# Patient Record
Sex: Male | Born: 1970 | Race: Black or African American | Hispanic: No | Marital: Married | Smoking: Never smoker
Health system: Southern US, Community
[De-identification: ages and names within clinical notes are randomized; demographics above are authoritative.]

## PROBLEM LIST (undated history)

## (undated) ENCOUNTER — Emergency Department (HOSPITAL_BASED_OUTPATIENT_CLINIC_OR_DEPARTMENT_OTHER): Admission: EM | Payer: BC Managed Care – PPO | Source: Home / Self Care

## (undated) DIAGNOSIS — I77810 Thoracic aortic ectasia: Secondary | ICD-10-CM

## (undated) DIAGNOSIS — M1990 Unspecified osteoarthritis, unspecified site: Secondary | ICD-10-CM

## (undated) DIAGNOSIS — I82409 Acute embolism and thrombosis of unspecified deep veins of unspecified lower extremity: Secondary | ICD-10-CM

## (undated) DIAGNOSIS — E119 Type 2 diabetes mellitus without complications: Secondary | ICD-10-CM

## (undated) DIAGNOSIS — L511 Stevens-Johnson syndrome: Secondary | ICD-10-CM

## (undated) DIAGNOSIS — I700 Atherosclerosis of aorta: Secondary | ICD-10-CM

## (undated) HISTORY — PX: KNEE ARTHROSCOPY: SHX127

## (undated) HISTORY — PX: LYMPH GLAND EXCISION: SHX13

## (undated) HISTORY — PX: SPINE SURGERY: SHX786

## (undated) HISTORY — DX: Unspecified osteoarthritis, unspecified site: M19.90

## (undated) HISTORY — DX: Stevens-Johnson syndrome: L51.1

## (undated) HISTORY — DX: Thoracic aortic ectasia: I77.810

## (undated) HISTORY — DX: Atherosclerosis of aorta: I70.0

## (undated) HISTORY — PX: CHEST TUBE INSERTION: SHX231

---
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
## 1999-03-13 ENCOUNTER — Encounter: Payer: Self-pay | Admitting: Emergency Medicine

## 1999-03-13 ENCOUNTER — Emergency Department (HOSPITAL_COMMUNITY): Admission: EM | Admit: 1999-03-13 | Discharge: 1999-03-13 | Payer: Self-pay | Admitting: Emergency Medicine

## 1999-03-15 ENCOUNTER — Encounter: Admission: RE | Admit: 1999-03-15 | Discharge: 1999-03-15 | Payer: Self-pay | Admitting: Thoracic Surgery

## 1999-03-15 ENCOUNTER — Encounter: Payer: Self-pay | Admitting: Thoracic Surgery

## 1999-03-21 ENCOUNTER — Encounter: Admission: RE | Admit: 1999-03-21 | Discharge: 1999-03-21 | Payer: Self-pay | Admitting: Thoracic Surgery

## 1999-03-21 ENCOUNTER — Encounter: Payer: Self-pay | Admitting: Thoracic Surgery

## 2001-12-16 ENCOUNTER — Encounter: Payer: Self-pay | Admitting: Emergency Medicine

## 2001-12-16 ENCOUNTER — Emergency Department (HOSPITAL_COMMUNITY): Admission: EM | Admit: 2001-12-16 | Discharge: 2001-12-16 | Payer: Self-pay | Admitting: Emergency Medicine

## 2002-10-07 ENCOUNTER — Inpatient Hospital Stay (HOSPITAL_COMMUNITY): Admission: AD | Admit: 2002-10-07 | Discharge: 2002-10-10 | Payer: Self-pay | Admitting: Family Medicine

## 2002-10-22 ENCOUNTER — Encounter: Admission: RE | Admit: 2002-10-22 | Discharge: 2002-10-22 | Payer: Self-pay | Admitting: Family Medicine

## 2004-09-10 ENCOUNTER — Emergency Department (HOSPITAL_COMMUNITY): Admission: EM | Admit: 2004-09-10 | Discharge: 2004-09-10 | Payer: Self-pay | Admitting: Emergency Medicine

## 2005-04-08 ENCOUNTER — Emergency Department (HOSPITAL_COMMUNITY): Admission: EM | Admit: 2005-04-08 | Discharge: 2005-04-08 | Payer: Self-pay | Admitting: Family Medicine

## 2006-07-18 ENCOUNTER — Emergency Department (HOSPITAL_COMMUNITY): Admission: EM | Admit: 2006-07-18 | Discharge: 2006-07-18 | Payer: Self-pay | Admitting: Emergency Medicine

## 2008-02-06 HISTORY — PX: OTHER SURGICAL HISTORY: SHX169

## 2008-04-14 ENCOUNTER — Ambulatory Visit: Payer: Self-pay | Admitting: Internal Medicine

## 2008-04-14 ENCOUNTER — Observation Stay (HOSPITAL_COMMUNITY): Admission: EM | Admit: 2008-04-14 | Discharge: 2008-04-16 | Payer: Self-pay | Admitting: Emergency Medicine

## 2009-05-12 ENCOUNTER — Emergency Department (HOSPITAL_COMMUNITY): Admission: EM | Admit: 2009-05-12 | Discharge: 2009-05-12 | Payer: Self-pay | Admitting: Emergency Medicine

## 2010-05-18 LAB — BASIC METABOLIC PANEL
BUN: 9 mg/dL (ref 6–23)
CO2: 28 mEq/L (ref 19–32)
CO2: 28 mEq/L (ref 19–32)
Calcium: 9 mg/dL (ref 8.4–10.5)
Chloride: 104 mEq/L (ref 96–112)
Creatinine, Ser: 0.83 mg/dL (ref 0.4–1.5)
GFR calc Af Amer: >60 mL/min (ref >60)
GFR calc non Af Amer: >60 mL/min (ref >60)
Glucose, Bld: 134 mg/dL — ABNORMAL HIGH (ref 70–99)
Glucose, Bld: 87 mg/dL (ref 70–99)
Potassium: 3.6 mEq/L (ref 3.5–5.1)
Potassium: 3.7 mEq/L (ref 3.5–5.1)
Sodium: 141 mEq/L (ref 135–145)
Sodium: 141 mEq/L (ref 135–145)

## 2010-05-18 LAB — CBC
HCT: 34.6 % — ABNORMAL LOW (ref 39.0–52.0)
HCT: 39.7 % (ref 39.0–52.0)
Hemoglobin: 13.4 g/dL (ref 13.0–17.0)
MCHC: 33.7 g/dL (ref 30.0–36.0)
MCHC: 34.6 g/dL (ref 30.0–36.0)
MCV: 85.5 fL (ref 78.0–100.0)
MCV: 86.1 fL (ref 78.0–100.0)
Platelets: 367 10*3/uL (ref 150–400)
RDW: 12.3 % (ref 11.5–15.5)
RDW: 12.6 % (ref 11.5–15.5)

## 2010-05-18 LAB — DIFFERENTIAL
Basophils Absolute: 0 10*3/uL (ref 0.0–0.1)
Basophils Relative: 0 % (ref 0–1)
Eosinophils Relative: 4 % (ref 0–5)
Monocytes Absolute: 0.8 10*3/uL (ref 0.1–1.0)

## 2010-05-18 LAB — HEPATIC FUNCTION PANEL
AST: 18 U/L (ref 0–37)
Bilirubin, Direct: 0.1 mg/dL (ref 0.0–0.3)
Indirect Bilirubin: 0.4 mg/dL (ref 0.3–0.9)

## 2010-05-18 LAB — RAPID URINE DRUG SCREEN, HOSP PERFORMED
Cocaine: NOT DETECTED
Tetrahydrocannabinol: NOT DETECTED

## 2010-05-18 LAB — URINALYSIS, ROUTINE W REFLEX MICROSCOPIC
Bilirubin Urine: NEGATIVE
Ketones, ur: NEGATIVE mg/dL
Nitrite: NEGATIVE
Urobilinogen, UA: 1 mg/dL (ref 0.0–1.0)
pH: 6 (ref 5.0–8.0)

## 2010-05-18 LAB — HEPATITIS PANEL, ACUTE
HCV Ab: NEGATIVE
Hep B C IgM: NEGATIVE
Hepatitis B Surface Ag: NEGATIVE

## 2010-05-18 LAB — FLUORESCENT TREPONEMAL AB(FTA)-IGG-BLD

## 2010-05-18 LAB — SEDIMENTATION RATE: Sed Rate: 6 mm/hr (ref 0–16)

## 2010-06-20 NOTE — Discharge Summary (Signed)
[name], [name] NO.:  [id number]   MEDICAL RECORD NO.:  [id number]          PATIENT TYPE:  INP   LOCATION:  1540                         FACILITY:  [facility]   PHYSICIAN:  Francisco Lawson, M.D.  DATE OF BIRTH:  1971/01/14   DATE OF ADMISSION:  04/14/2008  DATE OF DISCHARGE:  04/16/2008                               DISCHARGE SUMMARY   DISCHARGE DIAGNOSES:  1. Erythema multiforme.  2. History of methicillin-resistant Staphylococcus aureus cellulitis      in 2004, right lower extremity.  3. 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 History of subcutaneous hemothoraces at age 38-15.  53. Gastroesophageal reflux disease.   DISCHARGE MEDICATIONS:  1. Benadryl 25 mg tablet every 6 hours as needed.  2. Oxycodone 5 mg 1 tablet every 4-6 hours for pain.   DISPOSITION AND FOLLOW UP:  The patient was discharged from the unit in  stable condition with rash improving on soles and palms.  The patient is  ambulatory, stable, and afebrile. He will follow up with primary care  physician, Dr. Laney Pastor, for further evaluation and management.  In  addition, he will follow up with his dermatologist, Dr. Ronnald Ramp.   PROCEDURES:  April 14, 2008 CXR: biapical lung resection, questionable  COPD-based changes, no acute cardiopulmonary events.   CONSULTATIONS:  None.   HISTORY OF PRESENT ILLNESS:  The patient is a 40 year old male with  questionable diagnosis of Stevens-Johnson syndrome in 2004 and erythema  multiforme diagnosed as well in 2004.  The patient reports having first  episode of erythema multiforme in 2002 and was flare free until 2004  and since 2004 this is a new flare of EM.  This flare started 4-5 days  ago and has been worsening and as per the patient, the worst episode he  has had so far. It involves palms, soles, legs, arms, genitals, and is  painful to touch. Not itching.  As a result he is unable  to stand on  his feet.  In addition, he reports  generalized weakness.  He denies  fevers or chills, sick contacts, recent trauma. He does travel a lot due  to his job requirements.   ALLERGIES:  He has multiple allergies to FRUITS: LEMONS,  PINEAPPLES,CITRUS, and BANANAS, and MEDICATIONS: NSAID, PENICILLIN, and  TYLENOL.   VITAL SIGNS:  T 97.7, BP 126/79, P 78, R 18, Sat 95% on room air.   PHYSICAL EXAMINATION:  GENERAL:  Not in acute distress.  HEENT: PERRLA.  EOMI.  Nonicteric sclerae. No conjunctival pallor.  NECK: Supple.  No lymphadenopathy.  RESPIRATORY:  Scattered wheezes, mild, at baseline.  No stridor.  Good  air movement.  HEART:  Regular rate and rhythm.  No murmurs, rubs, or gallops.  ABDOMEN: Soft and nontender. No masses.  EXTREMITIES:  No edema. No cyanosis.  SKIN:  Tattoos, target-shaped, flat, slightly raised lesions on palms,  soles, genitals, arms, hands. Some lichenification of skin on legs and  palms.  LYMPHATICS: No lymphadenopathy of axillary, inguinal, or cervical  anterior or posterior lymph nodes  NEUROLOGICAL:  Alert and oriented  x 3.  Cranial nerves II through XII  are grossly intact.  No focal neurological deficits.   LABS:  Na 141, K 3.6, Cl 105, HCO3 28, BUN 12, Cr 1.02, Glu 87.  WBCs 7.5, ANC 4.3, Hg 13.4, MCV 86, Plt 426.   HOSPITAL COURSE BY PROBLEM:  1. Erythema multiforme - in patient with a history of 2 previous      flares in 2004 and first episode in 2002.  On physical examination      lesions are classic target lesions consistent with erythema      multiforme.  Unclear etiology, likely idiopathic, HSV negative, HIV      negative, hepatitis panel negative, UDS negative, RPR negative as      well.  This workup has been done on other previous visits during      these flare-ups and all the tests were negative at that time as      well.  The patient was clinically better by day 2 of      hospitalization and was able to ambulate with less pain.  He was      discharged on oxycodone for pain  control and will follow up with      his primary care doctor as well as dermatologist in an outpatient      setting.  The patient was advised to call back, come to the ER if      his symptoms do not improve or get worse.   Vitals on discharge  T 99.6, BP 118/85, P 89, R 18, Sat 98% on room air.   Labs from April 15, 2008  Na 141, K 3.7, Cl 104, HCO3 28, BUN 9, Cr 0.83, Glu 134.  WBCs 10, ANC 7.5, Hg 12, Plt 362.   Over 30 minutes were spent on discharging  the patient.      Francisco Curet, MD  Electronically Signed      Francisco Lawson, M.D.  Electronically Signed    IM/MEDQ  D:  04/16/2008  T:  04/16/2008  Job:  [id number]

## 2010-06-23 ENCOUNTER — Ambulatory Visit (INDEPENDENT_AMBULATORY_CARE_PROVIDER_SITE_OTHER): Payer: Medicare Other

## 2010-06-23 ENCOUNTER — Inpatient Hospital Stay (INDEPENDENT_AMBULATORY_CARE_PROVIDER_SITE_OTHER)
Admission: RE | Admit: 2010-06-23 | Discharge: 2010-06-23 | Disposition: A | Discharge status: Home / Self Care | Payer: Medicare Other | Source: Ambulatory Visit | Attending: Family Medicine | Admitting: Family Medicine

## 2010-06-23 DIAGNOSIS — IMO0002 Reserved for concepts with insufficient information to code with codable children: Secondary | ICD-10-CM

## 2010-06-23 NOTE — H&P (Signed)
NAME:  Francisco Lawson, Francisco Lawson                         ACCOUNT NO.:  [id number]   MEDICAL RECORD NO.:  [id number]                   PATIENT TYPE:  INP   LOCATION:  5735                                 FACILITY:  [facility]   PHYSICIAN:  Leighton Roach McDiarmid, M.D.             DATE OF BIRTH:  18-Dec-1970   DATE OF ADMISSION:  10/07/2002  DATE OF DISCHARGE:                                HISTORY & PHYSICAL   PRIMARY CARE Dahir Ayer:  [facility] [place].   CHIEF COMPLAINT:  Right lower extremity pain and swelling.   HISTORY OF PRESENT ILLNESS:  [name] is a 40 year old black male who  was in good health until about four days ago, when he awoke with a burning  tongue and whelps on her lips.  He had no tongue or lip swelling at that  time and was unable to bear weight on his right lower extremity secondary to  pain.  He had noted a small lesion on his right lower extremity as well as  right lower leg on the lateral aspect about two days prior (October 01, 2002)  which was pruritic.  He assumed this was a mosquito bite, as it looked like  one, although he did not see a bug bite him.  He went to the emergency  department in [place], [place] four days prior to admission (he was there  for work from October 01, 2002 through October 05, 2002), where he was told  that he had a brown recluse spider bite.  At that time, the lesion on the  lateral aspect of the right lower leg was red with some dark or black areas,  was more swollen, larger in size and draining pus, per the patient.  He was  started on Keflex, ibuprofen and hydrocodone.  He flew back to [place] two days ago.  One day prior to admission, he developed increased  swelling of his lips and development of blisters on his hands, knees, his  shins and elbows.  His right lower leg and foot had increased swelling and  continued drainage from the wound with splitting of the skin.  Today, he  went to [facility] for increased  swelling of his right foot,  throbbing pain in the foot and continued blisters/peeling of the skin.  He  thinks that today his leg looks slightly decreased in size, although the  foot has increased in size.  He has been cleaning the wound with peroxide,  bandaging it with an Ace wrap and using ice packs to help decrease the  swelling and pain.  He was sent to [facility] for failed  outpatient oral (p.o.) antibiotics for a cellulitis.   PAST MEDICAL HISTORY:  1. History of possible Stevens-Johnson syndrome in March 2004.  The cause     was unknown.  Per patient, was worse than now, to the point that he  was     unable to talk secondary to swelling.  He was treated as an outpatient     and given steroids, which led to resolution of the rash.  2. History of bilateral spontaneous pneumothoraces at age 23.  3. GERD.   PAST SURGICAL HISTORY:  Laser treatment and chest tubes for his  pneumothoraces.   MEDICATIONS:  1. Keflex 500 mg p.o. q.6 h. started on October 03, 2002.  2. Ibuprofen.  3. Hydrocodone.   ALLERGIES:  No known drug allergies.   SOCIAL HISTORY:  The patient lives alone in [place].  He has a girlfriend  of three years.  He also has family living in [place] and attended  [employer].  No college education.  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  His job lets him travel all over the country.  He denies any  tobacco, alcohol, IV drug use, other drugs or unprotected sex.  He does have  a 31-year-old son who lives with the son's mom.   REVIEW OF SYSTEMS:  Review of systems positive for chills and subjective  fevers (patient had a temperature of 100.6 in emergency department in  [place]).  Positive for right lower extremity heat.  Lesions are non-  pruritic.  Decreased p.o. secondary to mouth pain.  The patient denies  nausea, vomiting, diarrhea, chest pain, shortness of breath,  difficulty  swallowing or breathing, dysuria, genital lesions or blisters.   PHYSICAL EXAMINATION:  VITAL SIGNS:  Temperature 98.0, blood pressure  130/60, heart rate 70, respiratory rate 16, O2 saturation 98% on room air.  GENERAL:  Thin, young, black male in no acute distress and appears  comfortable except when palpating around right lower extremity wound.  HEENT:  PERRL.  EOMI.  Sclerae white.  Conjunctivae non-injected.  No  blisters seen on the eyelids or conjunctivae or nasal mucosa.  There is mild-  to-moderate swelling of the lips (lower greater than upper) with one to two  flaccid, nonhemorrhagic bullous lesions diffusely.  There is marked erythema  of the lips, especially the lower lip, and a sparse amount of dried blood  crusted on the lips, especially in the corners of the mouth.  Tongue has a  whitish patchy covering and several bullae on the anterior and anterolateral  aspects.  No blisters were seen on the buccal mucosa.  Mucous membranes were  just slightly dry and the posterior oropharynx was without erythema or  lesions.  NECK:  Supple.  No lymphadenopathy.  CARDIOVASCULAR:  Regular rate and rhythm.  No murmurs, rubs, or gallops.  Normal PMI.  Quiet precordium.  Radial pulses 2+ and symmetrical.  LUNGS:  Lungs clear to auscultation bilaterally with good air flow, no  increased work of breathing or wheezes, rhonchi or rales.  ABDOMEN:  Abdomen soft; positive bowel sounds; nontender, nondistended; no  masses or hepatosplenomegaly.  Groin:  There was bilateral inguinal  lymphadenopathy.  EXTREMITIES:  Diffuse, soft, semi-flaccid, nonhemorrhagic bullous lesions on  the elbows, knuckles, shins and knees, extending down to the ankle on the  right lower extremity.  There is edema of the dorsal right foot and right  lower leg that is minimally pitting.  The skin in this area of edema wrinkles and feels thin and fragile with palpation.  There are two areas on  the anterior  right shin where the superficial skin layer has peeled off,  exposing  red/pink tissue underneath.  There is an open, about 0.5-cm lesion  on the lateral aspect of the right lower leg with bright red blood covering  the opening.  There is calor and tenderness of the right lower leg around  this lesion and extending down to the foot.  The superficial skin  surrounding the open wounds can be easily peeled off.  The patient is able  to wiggle his toes bilaterally.  DP and PT pulses were 1+ on the left but  were unable to be palpated on the right secondary to the edema.  There are  target lesions noted on bilateral palms.  NEUROLOGIC:  Alert, oriented x4.  Full range of motion of both knees and the  left ankle, but decreased range of motion in the right ankle secondary to  edema and pain.  Otherwise, there were no focal deficits.   LABORATORY DATA:  Labs from [facility]:  White count 8.0,  hemoglobin 14.6, hematocrit 44.4, platelets 344,000; 71% neutrophils, 20%  lymphs, 10% monos; ESR 28 today (was 16 in March 2004).   ASSESSMENT AND PLAN:  [age]-year-old black male with probable  cellulitis of the right lower leg possibly secondary to an insect or spider  bite obtained in [place] about six days ago, also with diffuse bullous  lesions on the lips, hands, elbows and lower legs.   1. Right lower extremity cellulitis.  The patient has failed outpatient     Keflex, so was admitted for intravenous antibiotics.  Need to cover for     methicillin-resistant Staphylococcus aureus, so will use intravenous     vancomycin.  We will attempt to obtain wound culture after curetting off     the surface of the lesion.  Percocet and Tylenol for pain control.  We     will dress the wound daily.  Place on contact precautions     prophylactically in case he does have methicillin-resistant     Staphylococcus aureus.  Patient currently is afebrile with a normal white     count and differential.  We  will repeat a complete blood count with     differential here to check on his absolute neutrophil count.  Dr. Dan Humphreys     at [facility] recommended human immunodeficiency virus testing,     but the patient refuses this at this time and denies risk factors.     Consider ultrasound of right lower leg to rule out abscess.  2. Diffuse bullous lesions and lip swelling.  This is concerning for [employer] syndrome, which the patient may have had in March 2004.     Differential also includes bullous pemphigoid, drug-induced pemphigus     epidermolysis bullosa, erythema multiforme.  Follow for now.  Consider     steroids if worsens.  Possible etiology includes the Keflex, pain     medicines (ibuprofen and hydrocodone) or infection.  The patient denied     any drug allergies.  May need to start intravenous fluids if it worsens    and the patient is unable to take adequate orals.  No involvement of the     eyes currently, but consider ophthalmology consult.  3. Disposition.  The patient may need intravenous antibiotics as an     outpatient; if so, social work needs to be consulted for home health.      Georgina Peer, M.D.  Acquanetta Sit, M.D.    JM/MEDQ  D:  10/08/2002  T:  10/08/2002  Job:  [id number]   cc:   [place], [place] [facility]

## 2010-06-23 NOTE — Discharge Summary (Signed)
   NAME:  Francisco Lawson, Francisco Lawson                         ACCOUNT NO.:  [id number]   MEDICAL RECORD NO.:  [id number]                   PATIENT TYPE:  INP   LOCATION:  5743                                 FACILITY:  [facility]   PHYSICIAN:  Maylon Peppers. Waynette Buttery, M.D.               DATE OF BIRTH:  [birth year]   DATE OF ADMISSION:  DATE OF DISCHARGE:  10/10/2002                                 DISCHARGE SUMMARY   DISCHARGE MEDICATIONS:  1. Tetracycline 500 mg p.o. q.i.d. x2 weeks.  2. Chloraseptic and Magic Mouthwash as needed.   FOLLOW UP:  At the [facility] on Thursday or Friday of  this week.   CONSULTS:  Ophthalmology.   HOSPITAL COURSE:  [name] is a 40 year old African American [facility] [address].  Approximately four days prior to  admission he began having a burning on his tongue and whelps on his lips.  Please see admission H&P for details.    Laboratories on admission included a white count of 8.0, hemoglobin of 14.6,  hematocrit 44.4, platelet count 344, ESR is 28.   Problem 1.  Right lower extremity cellulitis.  The patient failed outpatient  Keflex treatment and was placed on IV vancomycin and the wounds were  cultured.  The patient received vancomycin until the morning of October 10, 2002, at which time the wound cultures came back showing that he did indeed  have MRSA, but that it was sensitive to vancomycin, levofloxacin, and  tetracycline.  The patient does not have prescription coverage, so it was  decided to send him home on erythromycin for two weeks.  The patient also  underwent HIV testing and RPR testing, both of which were nonreactive.   Problem 2.  Stevens-Johnson syndrome.  Ophthalmology was consulted and the  patient was determined to have no evidence of ocular involvement.  The  patient was placed on Chloraseptic and Magic Mouthwash for the sores in his  mouth and tongue.  He never actually needed any  steroids.                                                Maylon Peppers Waynette Buttery, M.D.    SAG/MEDQ  D:  10/10/2002  T:  10/10/2002  Job:  [id number]   cc:   [facility]

## 2010-06-23 NOTE — H&P (Signed)
   NAME:  JOSIEL, GAHM                         ACCOUNT NO.:  [id number]   MEDICAL RECORD NO.:  [id number]                   PATIENT TYPE:  INP   LOCATION:  5743                                 FACILITY:  [facility]   PHYSICIAN:  Leighton Roach McDiarmid, M.D.             DATE OF BIRTH:  02-07-1970   DATE OF ADMISSION:  10/07/2002  DATE OF DISCHARGE:                                HISTORY & PHYSICAL   CHIEF COMPLAINT:  Right lower extremity pain and swelling.   PRIMARY CARE PHYSICIAN:  [facility] [place].   HISTORY OF PRESENT ILLNESS:  The patient is a 40 year old black male who was  in good health until approximately four days prior to admission when he  awoke with a burning tongue and whelps on his lips.  He denied any tongue  or lip swelling at that time but was also unable to bear weight on his right  lower extremity secondary to pain.  He had noted a small lesion on his right  lower leg two days before (on August   DICTATION ENDS AT THIS POINT      Georgina Peer, M.D.                 Etta Grandchild, M.D.    JM/MEDQ  D:  10/08/2002  T:  10/09/2002  Job:  [id number]

## 2011-01-17 ENCOUNTER — Ambulatory Visit (INDEPENDENT_AMBULATORY_CARE_PROVIDER_SITE_OTHER): Payer: Medicare Other

## 2011-01-17 DIAGNOSIS — R0989 Other specified symptoms and signs involving the circulatory and respiratory systems: Secondary | ICD-10-CM

## 2011-01-17 DIAGNOSIS — IMO0001 Reserved for inherently not codable concepts without codable children: Secondary | ICD-10-CM

## 2011-01-17 DIAGNOSIS — R05 Cough: Secondary | ICD-10-CM

## 2011-01-17 DIAGNOSIS — R509 Fever, unspecified: Secondary | ICD-10-CM

## 2011-01-17 DIAGNOSIS — R059 Cough, unspecified: Secondary | ICD-10-CM

## 2011-02-02 ENCOUNTER — Ambulatory Visit (INDEPENDENT_AMBULATORY_CARE_PROVIDER_SITE_OTHER): Payer: Medicare Other

## 2011-02-02 DIAGNOSIS — J159 Unspecified bacterial pneumonia: Secondary | ICD-10-CM

## 2011-03-01 ENCOUNTER — Ambulatory Visit (INDEPENDENT_AMBULATORY_CARE_PROVIDER_SITE_OTHER): Payer: Medicare Other

## 2011-03-01 DIAGNOSIS — Z23 Encounter for immunization: Secondary | ICD-10-CM

## 2011-03-01 DIAGNOSIS — M549 Dorsalgia, unspecified: Secondary | ICD-10-CM

## 2011-04-09 ENCOUNTER — Ambulatory Visit: Payer: Medicare Other

## 2011-04-09 ENCOUNTER — Ambulatory Visit (INDEPENDENT_AMBULATORY_CARE_PROVIDER_SITE_OTHER): Payer: Medicare Other | Admitting: Family Medicine

## 2011-04-09 VITALS — BP 124/80 | HR 76 | Temp 98.1°F | Resp 16 | Ht 79.0 in | Wt 240.8 lb

## 2011-04-09 DIAGNOSIS — J029 Acute pharyngitis, unspecified: Secondary | ICD-10-CM

## 2011-04-09 DIAGNOSIS — J329 Chronic sinusitis, unspecified: Secondary | ICD-10-CM

## 2011-04-09 LAB — POCT RAPID STREP A (OFFICE): Rapid Strep A Screen: NEGATIVE

## 2011-04-09 MED ORDER — AZITHROMYCIN 250 MG PO TABS: ORAL_TABLET | ORAL | Status: AC

## 2011-04-09 NOTE — Progress Notes (Signed)
  [facility]:  Office Visit  Chief Complaint:  Chief Complaint  Patient presents with  . Sore Throat    x 3 days    HPI: Francisco Lawson is a 41 y.o. male who complains of + pharyngitis x 3 days,  + sinus pressure. Tried OTC meds without relief.  Past Medical History  Diagnosis Date  . Pneumothorax   . Erythema multiforme   . Asthma   . Stevens-Johnson syndrome     from ASA and NSAIDs   Past Surgical History  Procedure Date  . Biapical lung resection 2010   History   Social History  . Marital Status: Married    Spouse Name: N/A    Number of Children: N/A  . Years of Education: N/A   Social History Main Topics  . Smoking status: Never Smoker   . Smokeless tobacco: Not on file  . Alcohol Use: Yes  . Drug Use: No  . Sexually Active: Not on file   Other Topics Concern  . Not on file   Social History Narrative  . No narrative on file   Family History  Problem Relation Age of Onset  . Heart disease Mother   . Hypertension Mother   . Cancer Mother   . Arthritis Mother   . Heart disease Father   . Early death Father   . Hypertension Father    Allergies  Allergen Reactions  . Aspirin    Prior to Admission medications   Not on File     ROS: The patient denies fevers, chills, night sweats, unintentional weight loss, chest pain, palpitations, wheezing, dyspnea on exertion, nausea, vomiting, abdominal pain, dysuria, hematuria, melena, numbness, weakness, or tingling.   All other systems have been reviewed and were otherwise negative with the exception of those mentioned in the HPI and as above.    PHYSICAL EXAM: Filed Vitals:   04/09/11 1707  BP: 124/80  Pulse: 76  Temp: 98.1 F (36.7 C)  Resp: 16   Filed Vitals:   04/09/11 1707  Height: 6\' 7"  (2.007 m)  Weight: 240 lb 12.8 oz (109.226 kg)   Body mass index is 27.13 kg/(m^2).  General: Alert, no acute distress HEENT:  Normocephalic, atraumatic, oropharynx patent. Tm nl, +  sinus tenderness + erythema of throat, no exudates Cardiovascular:  Regular rate and rhythm, no rubs murmurs or gallops.  No Carotid bruits, radial pulse intact. No pedal edema. + coarse BS at apex of lungs ( pt states that is normal for him since lung surgery for pneumo) Respiratory: Clear to auscultation bilaterally.  No wheezes, rales, or rhonchi.  No cyanosis, no use of accessory musculature GI: No organomegaly, abdomen is soft and non-tender, positive bowel sounds.  No masses. Skin: No rashes. Neurologic: Facial musculature symmetric. Psychiatric: Patient is appropriate throughout our interaction. Lymphatic: No cervical lymphadenopathy Musculoskeletal: Gait intact.   LABS: Results for orders placed in visit on 04/09/11  POCT RAPID STREP A (OFFICE)      Component Value Range   Rapid Strep A Screen Negative  Negative      EKG/XRAY:   Primary read interpreted by Dr. Marin Comment at [facility].   ASSESSMENT/PLAN: Encounter Diagnoses  Name Primary?  . Pharyngitis Yes  . Sinusitis    Azithromycin Rx  Salt water gargles. Keep pushing fluids    Francisco Lawson, [place], DO 04/09/2011 9:08 PM

## 2012-01-03 ENCOUNTER — Encounter (HOSPITAL_BASED_OUTPATIENT_CLINIC_OR_DEPARTMENT_OTHER): Payer: Self-pay | Admitting: *Deleted

## 2012-01-03 ENCOUNTER — Emergency Department (HOSPITAL_BASED_OUTPATIENT_CLINIC_OR_DEPARTMENT_OTHER)
Admission: EM | Admit: 2012-01-03 | Discharge: 2012-01-04 | Disposition: A | Discharge status: Home / Self Care | Payer: Medicare Other | Attending: Emergency Medicine | Admitting: Emergency Medicine

## 2012-01-03 DIAGNOSIS — J029 Acute pharyngitis, unspecified: Secondary | ICD-10-CM | POA: Insufficient documentation

## 2012-01-03 DIAGNOSIS — L0231 Cutaneous abscess of buttock: Secondary | ICD-10-CM | POA: Insufficient documentation

## 2012-01-03 DIAGNOSIS — R062 Wheezing: Secondary | ICD-10-CM | POA: Insufficient documentation

## 2012-01-03 DIAGNOSIS — L03317 Cellulitis of buttock: Secondary | ICD-10-CM | POA: Insufficient documentation

## 2012-01-03 DIAGNOSIS — Z87898 Personal history of other specified conditions: Secondary | ICD-10-CM | POA: Insufficient documentation

## 2012-01-03 DIAGNOSIS — J40 Bronchitis, not specified as acute or chronic: Secondary | ICD-10-CM | POA: Insufficient documentation

## 2012-01-03 DIAGNOSIS — Z8709 Personal history of other diseases of the respiratory system: Secondary | ICD-10-CM | POA: Insufficient documentation

## 2012-01-03 DIAGNOSIS — IMO0002 Reserved for concepts with insufficient information to code with codable children: Secondary | ICD-10-CM

## 2012-01-03 DIAGNOSIS — J209 Acute bronchitis, unspecified: Secondary | ICD-10-CM

## 2012-01-03 NOTE — ED Notes (Signed)
Pt reports boil on buttocks x 3 days- also concerned he has cough and his daughter was recently dx with pneumonia

## 2012-01-04 ENCOUNTER — Emergency Department (HOSPITAL_BASED_OUTPATIENT_CLINIC_OR_DEPARTMENT_OTHER): Payer: Medicare Other

## 2012-01-04 MED ORDER — SULFAMETHOXAZOLE-TMP DS 800-160 MG PO TABS
1.0000 | ORAL_TABLET | Freq: Once | ORAL | Status: AC
  Administered 2012-01-04: 1 via ORAL
  Filled 2012-01-04: qty 1

## 2012-01-04 MED ORDER — ALBUTEROL SULFATE HFA 108 (90 BASE) MCG/ACT IN AERS
2.0000 | INHALATION_SPRAY | Freq: Once | RESPIRATORY_TRACT | Status: AC
  Administered 2012-01-04: 2 via RESPIRATORY_TRACT
  Filled 2012-01-04: qty 6.7

## 2012-01-04 MED ORDER — PREDNISONE 20 MG PO TABS: 40.0000 mg | ORAL_TABLET | Freq: Every day | ORAL | Status: DC

## 2012-01-04 MED ORDER — SULFAMETHOXAZOLE-TRIMETHOPRIM 800-160 MG PO TABS: 1.0000 | ORAL_TABLET | Freq: Two times a day (BID) | ORAL | Status: DC

## 2012-01-04 MED ORDER — LIDOCAINE HCL (PF) 1 % IJ SOLN
5.0000 mL | Freq: Once | INTRAMUSCULAR | Status: AC
  Administered 2012-01-04: 5 mL via INTRADERMAL
  Filled 2012-01-04: qty 5

## 2012-01-04 NOTE — ED Notes (Signed)
Pt d/c with rx x 2 for prednisone and bactrim- inhaler given with instruction for home use- dressing change instructions discussed- d/c home with family

## 2012-01-04 NOTE — ED Provider Notes (Signed)
History     CSN: [id number]  Arrival date & time 01/03/12  2334   First MD Initiated Contact with Patient 01/04/12 0027      Chief Complaint  Patient presents with  . Cough  . Abscess    (Consider location/radiation/quality/duration/timing/severity/associated sxs/prior treatment) Patient is a 41 y.o. male presenting with cough and abscess. The history is provided by the patient. No language interpreter was used.  Cough This is a recurrent problem. The current episode started more than 1 week ago. The problem occurs every few minutes. The problem has not changed since onset.The cough is non-productive. There has been no fever. The fever has been present for less than 1 day. Associated symptoms include sore throat (also losing voice occasionally.). Pertinent negatives include no chest pain, no chills, no sweats, no weight loss, no ear congestion, no ear pain, no rhinorrhea, no myalgias, no shortness of breath, no wheezing and no eye redness. He has tried nothing for the symptoms. He is not a smoker. His past medical history does not include bronchitis, pneumonia, bronchiectasis, COPD, emphysema or asthma.  Abscess  This is a new problem. The current episode started less than one week ago (3 days total). The onset was gradual. The problem has been gradually worsening. The abscess is present on the left buttock. The problem is severe. The abscess is characterized by painfulness, burning and swelling. The abscess first occurred at home. Associated symptoms include sore throat (also losing voice occasionally.) and cough. Pertinent negatives include no anorexia, no decrease in physical activity, not sleeping less, not drinking less, no fever, no diarrhea, no vomiting, no congestion, no rhinorrhea and no decreased responsiveness. There were sick contacts at home (infant daughter is ill with pneumonia). He has received no recent medical care.    Past Medical History  Diagnosis Date  . Pneumothorax     . Erythema multiforme   . Asthma   . Stevens-Johnson syndrome     from ASA and NSAIDs    Past Surgical History  Procedure Date  . Biapical lung resection 2010  . Chest tube insertion   . Lymph gland excision     Family History  Problem Relation Age of Onset  . Heart disease Mother   . Hypertension Mother   . Cancer Mother   . Arthritis Mother   . Heart disease Father   . Early death Father   . Hypertension Father     History  Substance Use Topics  . Smoking status: Never Smoker   . Smokeless tobacco: Never Used  . Alcohol Use: No     Comment: RARE      Review of Systems  Constitutional: Negative for fever, chills, weight loss and decreased responsiveness.  HENT: Positive for sore throat (also losing voice occasionally.). Negative for ear pain, congestion and rhinorrhea.   Eyes: Negative for redness.  Respiratory: Positive for cough. Negative for shortness of breath and wheezing.   Cardiovascular: Negative for chest pain.  Gastrointestinal: Negative for vomiting, diarrhea and anorexia.  Musculoskeletal: Negative for myalgias.  All other systems reviewed and are negative.    Allergies  Aspirin and Excedrin migraine  Home Medications  No current outpatient prescriptions on file.  BP 136/74  Pulse 70  Temp 98.6 F (37 C) (Oral)  Resp 18  Ht 6\' 6"  (1.981 m)  Wt 236 lb (107.049 kg)  BMI 27.27 kg/m2  SpO2 98%  Physical Exam  Nursing note and vitals reviewed. Constitutional: He is oriented to person, place,  and time. He appears well-developed and well-nourished. No distress.  HENT:  Head: Atraumatic.  Right Ear: External ear normal.  Left Ear: External ear normal.  Nose: Nose normal.  Mouth/Throat: Oropharynx is clear and moist. No oropharyngeal exudate.  Eyes: Conjunctivae normal are normal. Pupils are equal, round, and reactive to light. Right eye exhibits no discharge. Left eye exhibits no discharge. No scleral icterus.  Neck: Normal range of  motion. No JVD present. No tracheal deviation present.  Cardiovascular: Normal rate, regular rhythm and intact distal pulses.  Exam reveals no gallop and no friction rub.   No murmur heard. Pulmonary/Chest: Effort normal. No stridor. No respiratory distress. He has wheezes (diffuse). He has no rales. He exhibits no tenderness.  Abdominal: Soft. Bowel sounds are normal. He exhibits no distension and no mass. There is no tenderness. There is no rebound and no guarding.  Musculoskeletal: Normal range of motion. He exhibits no edema and no tenderness.  Lymphadenopathy:    He has no cervical adenopathy.  Neurological: He is alert and oriented to person, place, and time. No cranial nerve deficit.  Skin: Skin is warm and dry. No abrasion, no bruising, no burn, no ecchymosis, no laceration, no lesion, no petechiae, no purpura and no rash noted. Rash is not macular, not papular, not maculopapular, not nodular, not pustular, not vesicular and not urticarial. He is not diaphoretic. There is erythema. No cyanosis. No pallor. Nails show no clubbing.          5x7 cm area of induration and erythema surrounding a small 1 cm in diameter dome of fluctuance.  Psychiatric: He has a normal mood and affect. His behavior is normal.    ED Course  INCISION AND DRAINAGE Date/Time: 01/04/2012 2:47 AM Performed by: Beaulah Dinning T Authorized by: Beaulah Dinning T Consent: Verbal consent obtained. Written consent not obtained. Risks and benefits: risks, benefits and alternatives were discussed Consent given by: patient Test results: test results available and properly labeled Site marked: the operative site was marked Required items: required blood products, implants, devices, and special equipment available Patient identity confirmed: verbally with patient Time out: Immediately prior to procedure a "time out" was called to verify the correct patient, procedure, equipment, support staff and site/side marked as  required. Type: abscess Location: left buttock. Anesthesia: local infiltration Local anesthetic: lidocaine 1% without epinephrine Anesthetic total: 5 ml Patient sedated: no Scalpel size: 11 Needle gauge: 18 Incision type: elliptical Complexity: simple Drainage: purulent and bloody Drainage amount: scant Wound treatment: wound left open Patient tolerance: Patient tolerated the procedure well with no immediate complications. Comments: Performed needle aspiration initially, then transitioned to incision and drainage.   (including critical care time)  Labs Reviewed - No data to display Dg Chest 2 View  01/04/2012  *RADIOLOGY REPORT*  Clinical Data: Wheezing and cough.  CHEST - 2 VIEW  Comparison: Chest radiograph performed 04/14/2008  Findings: The lungs are well-aerated and clear.  There is no evidence of focal opacification, pleural effusion or pneumothorax.  The heart is normal in size; the mediastinal contour is within normal limits.  No acute osseous abnormalities are seen.  IMPRESSION: No acute cardiopulmonary process seen.   Original Report Authenticated By: Santa Lighter, M.D.      No diagnosis found.    MDM  Pt presents for evaluation of a persistent cough and a abscess on his left buttock.  He appears comfortable, note wheezing on exam, stable VS, NAD.  He has no hx of asthma, will obtain  a CXR.  He appears nontoxic.  There is an are of fluctuance on his left buttock.  Will apply a field block using lidocaine and perform and incision and drainage.  0245.  CXR is negative for an infiltrate.  Cough is improved s/p albuterol.  Will treat for bronchitis with bronchospasm.  Performed I&D on abscess.  Very minimal purulent material was expressed.  Will provide a course of bactrim as he had more surrounding erythema and induration than fluctuance or actual abscess.  Plan close outpt follow-up.       Perlie Mayo, MD 01/04/12 [id number]

## 2012-01-04 NOTE — ED Notes (Signed)
MD at bedside. 

## 2012-01-04 NOTE — ED Notes (Signed)
Patient transported to X-ray 

## 2012-05-22 ENCOUNTER — Encounter (HOSPITAL_BASED_OUTPATIENT_CLINIC_OR_DEPARTMENT_OTHER): Payer: Self-pay | Admitting: Emergency Medicine

## 2012-05-22 ENCOUNTER — Emergency Department (HOSPITAL_BASED_OUTPATIENT_CLINIC_OR_DEPARTMENT_OTHER)
Admission: EM | Admit: 2012-05-22 | Discharge: 2012-05-22 | Disposition: A | Discharge status: Home / Self Care | Payer: Medicare Other | Attending: Emergency Medicine | Admitting: Emergency Medicine

## 2012-05-22 DIAGNOSIS — L02219 Cutaneous abscess of trunk, unspecified: Secondary | ICD-10-CM | POA: Insufficient documentation

## 2012-05-22 DIAGNOSIS — Z8614 Personal history of Methicillin resistant Staphylococcus aureus infection: Secondary | ICD-10-CM | POA: Insufficient documentation

## 2012-05-22 DIAGNOSIS — Z872 Personal history of diseases of the skin and subcutaneous tissue: Secondary | ICD-10-CM | POA: Insufficient documentation

## 2012-05-22 DIAGNOSIS — Y929 Unspecified place or not applicable: Secondary | ICD-10-CM | POA: Insufficient documentation

## 2012-05-22 DIAGNOSIS — Y939 Activity, unspecified: Secondary | ICD-10-CM | POA: Insufficient documentation

## 2012-05-22 DIAGNOSIS — J45909 Unspecified asthma, uncomplicated: Secondary | ICD-10-CM | POA: Insufficient documentation

## 2012-05-22 DIAGNOSIS — Z8709 Personal history of other diseases of the respiratory system: Secondary | ICD-10-CM | POA: Insufficient documentation

## 2012-05-22 DIAGNOSIS — IMO0002 Reserved for concepts with insufficient information to code with codable children: Secondary | ICD-10-CM | POA: Insufficient documentation

## 2012-05-22 DIAGNOSIS — L0291 Cutaneous abscess, unspecified: Secondary | ICD-10-CM

## 2012-05-22 MED ORDER — SULFAMETHOXAZOLE-TRIMETHOPRIM 800-160 MG PO TABS: 1.0000 | ORAL_TABLET | Freq: Two times a day (BID) | ORAL | Status: DC

## 2012-05-22 MED ORDER — HYDROCODONE-ACETAMINOPHEN 5-325 MG PO TABS
2.0000 | ORAL_TABLET | Freq: Once | ORAL | Status: AC
  Administered 2012-05-22: 2 via ORAL
  Filled 2012-05-22: qty 2

## 2012-05-22 MED ORDER — LIDOCAINE HCL (PF) 1 % IJ SOLN
5.0000 mL | Freq: Once | INTRAMUSCULAR | Status: AC
  Administered 2012-05-22: 5 mL
  Filled 2012-05-22: qty 5

## 2012-05-22 MED ORDER — HYDROCODONE-ACETAMINOPHEN 5-325 MG PO TABS: 2.0000 | ORAL_TABLET | ORAL | Status: DC | PRN

## 2012-05-22 MED ORDER — SULFAMETHOXAZOLE-TMP DS 800-160 MG PO TABS
1.0000 | ORAL_TABLET | Freq: Once | ORAL | Status: AC
  Administered 2012-05-22: 1 via ORAL
  Filled 2012-05-22: qty 1

## 2012-05-22 NOTE — ED Provider Notes (Signed)
History     CSN: [id number]  Arrival date & time 05/22/12  2123   First MD Initiated Contact with Patient 05/22/12 2157      Chief Complaint  Patient presents with  . Insect Bite  . Abscess    (Consider location/radiation/quality/duration/timing/severity/associated sxs/prior treatment) HPI Comments: Patient complains of a sore on his upper back. He states it's been gone about 2-3 days but is becoming more painful and swollen. 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 He also has noted some small red areas on his left arm that he thinks were insect bites. He states that they're itchy. They're not painful. He does have constant throbbing pain on the swelling in his back but the other areas are nonpainful. He denies any fevers. He denies any nausea or vomiting. He denies any drainage from the sore on his back.  Patient is a 42 y.o. male presenting with abscess.  Abscess Associated symptoms: no fatigue, no fever, no headaches, no nausea and no vomiting     Past Medical History  Diagnosis Date  . Pneumothorax   . Erythema multiforme   . Asthma   . Stevens-Johnson syndrome     from ASA and NSAIDs    Past Surgical History  Procedure Laterality Date  . Biapical lung resection  2010  . Chest tube insertion    . Lymph gland excision      Family History  Problem Relation Age of Onset  . Heart disease Mother   . Hypertension Mother   . Cancer Mother   . Arthritis Mother   . Heart disease Father   . Early death Father   . Hypertension Father     History  Substance Use Topics  . Smoking status: Never Smoker   . Smokeless tobacco: Never Used  . Alcohol Use: No     Comment: RARE      Review of Systems  Constitutional: Negative for fever, chills, diaphoresis and fatigue.  HENT: Negative for congestion, rhinorrhea and sneezing.   Eyes: Negative.   Respiratory: Negative for cough, chest tightness and shortness of breath.   Cardiovascular: Negative for chest  pain and leg swelling.  Gastrointestinal: Negative for nausea, vomiting, abdominal pain, diarrhea and blood in stool.  Genitourinary: Negative for frequency, hematuria, flank pain and difficulty urinating.  Musculoskeletal: Negative for back pain and arthralgias.  Skin: Positive for rash.  Neurological: Negative for dizziness, speech difficulty, weakness, numbness and headaches.    Allergies  Aspirin and Excedrin migraine  Home Medications   Current Outpatient Rx  Name  Route  Sig  Dispense  Refill  . HYDROcodone-acetaminophen (NORCO/VICODIN) 5-325 MG per tablet   Oral   Take 2 tablets by mouth every 4 (four) hours as needed for pain.   15 tablet   0   . predniSONE (DELTASONE) 20 MG tablet   Oral   Take 2 tablets (40 mg total) by mouth daily.   10 tablet   0   . sulfamethoxazole-trimethoprim (SEPTRA DS) 800-160 MG per tablet   Oral   Take 1 tablet by mouth every 12 (twelve) hours.   14 tablet   0   . sulfamethoxazole-trimethoprim (SEPTRA DS) 800-160 MG per tablet   Oral   Take 1 tablet by mouth 2 (two) times daily.   20 tablet   0     BP 145/86  Pulse 59  Temp(Src) 98.1 F (36.7 C) (Oral)  Resp 18  Ht 6\' 6"  (1.981 m)  Wt 230 lb (104.327 kg)  BMI 26.58 kg/m2  SpO2 97%  Physical Exam  Constitutional: He is oriented to person, place, and time. He appears well-developed and well-nourished.  HENT:  Head: Normocephalic and atraumatic.  Eyes: Pupils are equal, round, and reactive to light.  Neck: Normal range of motion. Neck supple.  Cardiovascular: Normal rate, regular rhythm and normal heart sounds.   Pulmonary/Chest: Effort normal and breath sounds normal. No respiratory distress. He has no wheezes. He has no rales. He exhibits no tenderness.  Abdominal: Soft. Bowel sounds are normal. There is no tenderness. There is no rebound and no guarding.  Musculoskeletal: Normal range of motion. He exhibits no edema.  Lymphadenopathy:    He has no cervical adenopathy.   Neurological: He is alert and oriented to person, place, and time.  Skin: Skin is warm and dry. No rash noted.  Patient is a 2 cm indurated area to his right upper back that has a small pustule in the center of the area. There's no drainage. There is mild surrounding erythema. It is tender to palpation. He has 4-5 circular erythematous areas on the volar surface of his right forearm with small vesicles in the center of the lesions. There is no induration or fluctuance to these areas and they are nontender on palpation. There is no other rash identified.  Psychiatric: He has a normal mood and affect.    ED Course  INCISION AND DRAINAGE Date/Time: 05/22/2012 10:48 PM Performed by: Zahira Brummond Authorized by: Malvin Johns Consent: Verbal consent obtained. Risks and benefits: risks, benefits and alternatives were discussed Consent given by: patient Patient identity confirmed: verbally with patient Type: abscess Body area: trunk Location details: back Anesthesia: local infiltration Local anesthetic: lidocaine 1% without epinephrine Anesthetic total: 3 ml Patient sedated: no Scalpel size: 11 Incision type: elliptical Complexity: simple Drainage: purulent Drainage amount: scant Wound treatment: wound left open Patient tolerance: Patient tolerated the procedure well with no immediate complications.   (including critical care time)  Labs Reviewed - No data to display No results found.   1. Abscess       MDM  Patient has an abscess to his upper back. This was I&D and he was started on Bactrim and Vicodin for pain. The smaller areas on his forearms seem to be more consistent with insect bites. They are pruritic. They do not appear to be consistent with scabies. Patient's tetanus shot is up-to-date. I advised him to use warm compresses and followup with his doctor or the ED in 2 days for wound check or sooner if his symptoms worsen.       Malvin Johns, MD 05/22/12 2248

## 2012-05-22 NOTE — ED Notes (Signed)
Red, itchy, areas on left forearm x3 days.  Pt thought was mosquito bites.  Also has area on back that he thought was the same but it is hard, painful and is coming to a head.

## 2012-07-30 ENCOUNTER — Encounter (HOSPITAL_BASED_OUTPATIENT_CLINIC_OR_DEPARTMENT_OTHER): Payer: Self-pay | Admitting: Emergency Medicine

## 2012-07-30 ENCOUNTER — Emergency Department (HOSPITAL_BASED_OUTPATIENT_CLINIC_OR_DEPARTMENT_OTHER)
Admission: EM | Admit: 2012-07-30 | Discharge: 2012-07-30 | Disposition: A | Discharge status: Home / Self Care | Payer: Medicare Other | Attending: Emergency Medicine | Admitting: Emergency Medicine

## 2012-07-30 DIAGNOSIS — Z792 Long term (current) use of antibiotics: Secondary | ICD-10-CM | POA: Insufficient documentation

## 2012-07-30 DIAGNOSIS — L0233 Carbuncle of buttock: Secondary | ICD-10-CM | POA: Insufficient documentation

## 2012-07-30 DIAGNOSIS — L089 Local infection of the skin and subcutaneous tissue, unspecified: Secondary | ICD-10-CM | POA: Insufficient documentation

## 2012-07-30 DIAGNOSIS — Z8709 Personal history of other diseases of the respiratory system: Secondary | ICD-10-CM | POA: Insufficient documentation

## 2012-07-30 DIAGNOSIS — IMO0002 Reserved for concepts with insufficient information to code with codable children: Secondary | ICD-10-CM | POA: Insufficient documentation

## 2012-07-30 DIAGNOSIS — L02429 Furuncle of limb, unspecified: Secondary | ICD-10-CM | POA: Insufficient documentation

## 2012-07-30 DIAGNOSIS — Z872 Personal history of diseases of the skin and subcutaneous tissue: Secondary | ICD-10-CM | POA: Insufficient documentation

## 2012-07-30 DIAGNOSIS — J45909 Unspecified asthma, uncomplicated: Secondary | ICD-10-CM | POA: Insufficient documentation

## 2012-07-30 DIAGNOSIS — L0231 Cutaneous abscess of buttock: Secondary | ICD-10-CM | POA: Insufficient documentation

## 2012-07-30 DIAGNOSIS — B9689 Other specified bacterial agents as the cause of diseases classified elsewhere: Secondary | ICD-10-CM

## 2012-07-30 MED ORDER — LIDOCAINE-EPINEPHRINE 2 %-1:100000 IJ SOLN
INTRAMUSCULAR | Status: AC
  Filled 2012-07-30: qty 1

## 2012-07-30 MED ORDER — CEPHALEXIN 500 MG PO CAPS: 500.0000 mg | ORAL_CAPSULE | Freq: Four times a day (QID) | ORAL | Status: DC

## 2012-07-30 MED ORDER — LIDOCAINE HCL 2 % IJ SOLN
INTRAMUSCULAR | Status: AC
  Filled 2012-07-30: qty 20

## 2012-07-30 MED ORDER — SULFAMETHOXAZOLE-TRIMETHOPRIM 800-160 MG PO TABS: 1.0000 | ORAL_TABLET | Freq: Two times a day (BID) | ORAL | Status: AC

## 2012-07-30 NOTE — ED Provider Notes (Signed)
   History    CSN: [id number] Arrival date & time 07/30/12  1904  First MD Initiated Contact with Patient 07/30/12 2027     Chief Complaint  Patient presents with  . Abscess   (Consider location/radiation/quality/duration/timing/severity/associated sxs/prior Treatment) Patient is a 42 y.o. male presenting with abscess. The history is provided by the patient. No language interpreter was used.  Abscess Location:  Ano-genital Ano-genital abscess location:  L buttock and R buttock Abscess quality: painful, redness and warmth   Red streaking: no   Progression:  Worsening Pain details:    Quality:  No pain   Timing:  Intermittent Ineffective treatments:  Warm compresses and warm water soaks Risk factors: family hx of MRSA   Pt reports multiple boils on hip and buttock  Past Medical History  Diagnosis Date  . Pneumothorax   . Erythema multiforme   . Asthma   . 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 Biapical lung resection  2010  . Chest tube insertion    . Lymph gland excision     Family History  Problem Relation Age of Onset  . Heart disease Mother   . Hypertension Mother   . Cancer Mother   . Arthritis Mother   . Heart disease Father   . Early death Father   . Hypertension Father    History  Substance Use Topics  . Smoking status: Never Smoker   . Smokeless tobacco: Never Used  . Alcohol Use: No     Comment: RARE    Review of Systems  All other systems reviewed and are negative.    Allergies  Aspirin and Excedrin migraine  Home Medications   Current Outpatient Rx  Name  Route  Sig  Dispense  Refill  . HYDROcodone-acetaminophen (NORCO/VICODIN) 5-325 MG per tablet   Oral   Take 2 tablets by mouth every 4 (four) hours as needed for pain.   15 tablet   0   . predniSONE (DELTASONE) 20 MG tablet   Oral   Take 2 tablets (40 mg total) by mouth daily.   10 tablet   0   .  sulfamethoxazole-trimethoprim (SEPTRA DS) 800-160 MG per tablet   Oral   Take 1 tablet by mouth every 12 (twelve) hours.   14 tablet   0   . sulfamethoxazole-trimethoprim (SEPTRA DS) 800-160 MG per tablet   Oral   Take 1 tablet by mouth 2 (two) times daily.   20 tablet   0    BP 151/90  Pulse 77  Temp(Src) 98.4 F (36.9 C) (Oral)  Resp 18  SpO2 95% Physical Exam  Nursing note and vitals reviewed. Constitutional: He is oriented to person, place, and time. He appears well-developed and well-nourished.  HENT:  Head: Normocephalic and atraumatic.  Musculoskeletal: He exhibits tenderness.  Multiple small pimples bilat buttocks and upper legs,  No abscessed areas  Neurological: He is alert and oriented to person, place, and time. He has normal reflexes.  Skin: There is erythema.  Psychiatric: He has a normal mood and affect.    ED Course  Procedures (including critical care time) Labs Reviewed - No data to display No results found. No diagnosis found.  MDM  Bactrim  [place] Camp, [place] 07/30/12 2052

## 2012-07-30 NOTE — ED Notes (Signed)
Pt c/o abscess on right hip area. Pt has recurrent issues with this.

## 2012-07-31 NOTE — ED Provider Notes (Signed)
Medical screening examination/treatment/procedure(s) were performed by non-physician practitioner and as supervising physician I was immediately available for consultation/collaboration.   Loren Racer, MD 07/31/12 [phone]

## 2012-08-05 ENCOUNTER — Encounter (HOSPITAL_BASED_OUTPATIENT_CLINIC_OR_DEPARTMENT_OTHER): Payer: Self-pay | Admitting: *Deleted

## 2012-08-05 ENCOUNTER — Emergency Department (HOSPITAL_BASED_OUTPATIENT_CLINIC_OR_DEPARTMENT_OTHER)
Admission: EM | Admit: 2012-08-05 | Discharge: 2012-08-05 | Disposition: A | Discharge status: Home / Self Care | Payer: Medicare Other | Attending: Emergency Medicine | Admitting: Emergency Medicine

## 2012-08-05 ENCOUNTER — Emergency Department (HOSPITAL_BASED_OUTPATIENT_CLINIC_OR_DEPARTMENT_OTHER): Payer: Medicare Other

## 2012-08-05 DIAGNOSIS — X500XXA Overexertion from strenuous movement or load, initial encounter: Secondary | ICD-10-CM | POA: Insufficient documentation

## 2012-08-05 DIAGNOSIS — Z872 Personal history of diseases of the skin and subcutaneous tissue: Secondary | ICD-10-CM | POA: Insufficient documentation

## 2012-08-05 DIAGNOSIS — J45909 Unspecified asthma, uncomplicated: Secondary | ICD-10-CM | POA: Insufficient documentation

## 2012-08-05 DIAGNOSIS — Y929 Unspecified place or not applicable: Secondary | ICD-10-CM | POA: Insufficient documentation

## 2012-08-05 DIAGNOSIS — Y939 Activity, unspecified: Secondary | ICD-10-CM | POA: Insufficient documentation

## 2012-08-05 DIAGNOSIS — S93609A Unspecified sprain of unspecified foot, initial encounter: Secondary | ICD-10-CM | POA: Insufficient documentation

## 2012-08-05 DIAGNOSIS — S93602A Unspecified sprain of left foot, initial encounter: Secondary | ICD-10-CM

## 2012-08-05 DIAGNOSIS — L511 Stevens-Johnson syndrome: Secondary | ICD-10-CM | POA: Insufficient documentation

## 2012-08-05 DIAGNOSIS — Z8709 Personal history of other diseases of the respiratory system: Secondary | ICD-10-CM | POA: Insufficient documentation

## 2012-08-05 MED ORDER — HYDROCODONE-ACETAMINOPHEN 5-325 MG PO TABS: 2.0000 | ORAL_TABLET | ORAL | Status: DC | PRN

## 2012-08-05 NOTE — ED Notes (Signed)
Patient transported to X-ray 

## 2012-08-05 NOTE — ED Provider Notes (Signed)
History    CSN: [id number] Arrival date & time 08/05/12  1956  First MD Initiated Contact with Patient 08/05/12 2012     Chief Complaint  Patient presents with  . Foot Pain   (Consider location/radiation/quality/duration/timing/severity/associated sxs/prior Treatment) Patient is a 42 y.o. male presenting with foot injury. The history is provided by the patient. No language interpreter was used.  Foot Injury Location:  Foot Foot location:  L foot Pain details:    Quality:  Aching   Radiates to:  Does not radiate   Severity:  Moderate   Onset quality:  Gradual   Progression:  Worsening Chronicity:  New Foreign body present:  No foreign bodies Relieved by:  Nothing Ineffective treatments:  None tried Pt complains of pain in his left foot.   Pt reports when he got out of bed foot popped.   Area on side of foot is swollen and painful Past Medical History  Diagnosis Date  . Pneumothorax   . Erythema multiforme   . Asthma   . 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 Biapical lung resection  2010  . Chest tube insertion    . Lymph gland excision     Family History  Problem Relation Age of Onset  . Heart disease Mother   . Hypertension Mother   . Cancer Mother   . Arthritis Mother   . Heart disease Father   . Early death Father   . Hypertension Father    History  Substance Use Topics  . Smoking status: Never Smoker   . Smokeless tobacco: Never Used  . Alcohol Use: No     Comment: RARE    Review of Systems  Musculoskeletal: Positive for myalgias and joint swelling.  All other systems reviewed and are negative.    Allergies  Aspirin and Excedrin migraine  Home Medications   Current Outpatient Rx  Name  Route  Sig  Dispense  Refill  . cephALEXin (KEFLEX) 500 MG capsule   Oral   Take 1 capsule (500 mg total) by mouth 4 (four) times daily.   40 capsule   0   . HYDROcodone-acetaminophen  (NORCO/VICODIN) 5-325 MG per tablet   Oral   Take 2 tablets by mouth every 4 (four) hours as needed for pain.   15 tablet   0   . predniSONE (DELTASONE) 20 MG tablet   Oral   Take 2 tablets (40 mg total) by mouth daily.   10 tablet   0   . sulfamethoxazole-trimethoprim (BACTRIM DS,SEPTRA DS) 800-160 MG per tablet   Oral   Take 1 tablet by mouth 2 (two) times daily.   20 tablet   0   . sulfamethoxazole-trimethoprim (SEPTRA DS) 800-160 MG per tablet   Oral   Take 1 tablet by mouth every 12 (twelve) hours.   14 tablet   0   . sulfamethoxazole-trimethoprim (SEPTRA DS) 800-160 MG per tablet   Oral   Take 1 tablet by mouth 2 (two) times daily.   20 tablet   0    BP 130/85  Pulse 76  Temp(Src) 98.1 F (36.7 C)  Resp 16  Ht 6\' 6"  (1.981 m)  Wt 245 lb (111.131 kg)  BMI 28.32 kg/m2 Physical Exam  Constitutional: He is oriented to person, place, and time. He appears well-developed and well-nourished.  Musculoskeletal: He exhibits tenderness.  Tender swollen 5th metatarsal head,   From,  Ns and nv intact  Neurological: He is alert and oriented to person, place, and time. He has normal reflexes.  Skin: Skin is warm.  Psychiatric: He has a normal mood and affect.    ED Course  Procedures (including critical care time) Labs Reviewed - No data to display Dg Foot Complete Left  08/05/2012   *RADIOLOGY REPORT*  Clinical Data: Left foot pain.  LEFT FOOT - COMPLETE 3+ VIEW  Comparison: None.  Findings: Imaged bones, joints and soft tissues appear normal.  IMPRESSION: Negative exam.   Original Report Authenticated By: Orlean Patten, M.D.   1. Sprain of left foot, initial encounter     MDM  No fx,   Pt placed in ace and post op shoe.   Pain medication,    Pt advised to call Dr. Barbaraann Barthel to be seen for evaluation  Fransico Meadow, PA-C 08/05/12 2121

## 2012-08-05 NOTE — ED Provider Notes (Signed)
Medical screening examination/treatment/procedure(s) were performed by non-physician practitioner and as supervising physician I was immediately available for consultation/collaboration.   Glynn Octave, MD 08/05/12 2209

## 2012-08-05 NOTE — ED Notes (Signed)
Pt c/o left foot pain x 17 hrs denies injury

## 2012-08-07 ENCOUNTER — Encounter: Payer: Self-pay | Admitting: Family Medicine

## 2012-08-07 ENCOUNTER — Ambulatory Visit (INDEPENDENT_AMBULATORY_CARE_PROVIDER_SITE_OTHER): Payer: Medicare Other | Admitting: Family Medicine

## 2012-08-07 VITALS — BP 131/92 | HR 81 | Ht 78.0 in | Wt 237.0 lb

## 2012-08-07 DIAGNOSIS — M79672 Pain in left foot: Secondary | ICD-10-CM

## 2012-08-07 DIAGNOSIS — M79609 Pain in unspecified limb: Secondary | ICD-10-CM

## 2012-08-07 DIAGNOSIS — M25561 Pain in right knee: Secondary | ICD-10-CM

## 2012-08-07 DIAGNOSIS — M25569 Pain in unspecified knee: Secondary | ICD-10-CM

## 2012-08-07 NOTE — Patient Instructions (Addendum)
Your foot pain is due to an intermetatarsal ligament sprain (stretch injury or tear of a ligament between 4th and 5th metatarsals). This should heal completely over next 3-4 weeks. Icing 15 minutes at a time 3-4 times a day. Postop shoe as needed for comfort. Elevation, ace wrap for compression to help with swelling.  Your knee pain is due to either a medial meniscal tear or patellofemoral syndrome. We will start with conservative treatment for both. Aleve 2 tabs twice a day with food OR ibuprofen 3 tabs three times a day with food for pain and inflammation. Do home exercise program every day 3 sets of 10 (straight leg raise, leg raise with foot turned outward, and side leg raises). Add ankle weight if these become too easy. Arch supports (consider dr. Jari Sportsman active series) and/or supportive running/tennis shoes important for both conditions. Icing as needed. Knee braces unlikely to be helpful. Follow up with me in 4 weeks for both issues. If not improving would consider injection, formal physical therapy, MRI.

## 2012-08-11 ENCOUNTER — Encounter: Payer: Self-pay | Admitting: Family Medicine

## 2012-08-11 DIAGNOSIS — M25561 Pain in right knee: Secondary | ICD-10-CM | POA: Insufficient documentation

## 2012-08-11 DIAGNOSIS — M79672 Pain in left foot: Secondary | ICD-10-CM | POA: Insufficient documentation

## 2012-08-11 NOTE — Progress Notes (Signed)
Patient ID: Francisco Lawson, male   DOB: [birth year], 42 y.o.   MRN: [id number]  PCP: Dr. Laney Pastor  Subjective:   HPI: Patient is a 42 y.o. male here for left foot, right knee pain.  1. Left foot pain  Patient reports he had some lateral left foot pain leading up to current injury. Then went to jump out of bed on 7/1 and felt a sharp pain, pop lateral aspect of foot. Associated swelling. Had x-rays that were negative in ED. Has been elevating, using ace wrap. Taking norco as needed for pain.  2. Right knee pain Reports intermittent problems with this. Has popping and occasional swelling of knee. Recalls turning his knee once when foot was planted. No locking, giving out. No prior treatment  Past Medical History  Diagnosis Date  . Pneumothorax   . Erythema multiforme   . Asthma   . Stevens-Johnson syndrome     from ASA and NSAIDs    Current Outpatient Prescriptions on File Prior to Visit  Medication Sig Dispense Refill  . HYDROcodone-acetaminophen (NORCO/VICODIN) 5-325 MG per tablet Take 2 tablets by mouth every 4 (four) hours as needed.  20 tablet  0   No current facility-administered medications on file prior to visit.    Past Surgical History  Procedure Laterality Date  . Biapical lung resection  2010  . Chest tube insertion    . Lymph gland excision      Allergies  Allergen Reactions  . Aspirin     [name]  . Excedrin Migraine (Aspirin-Acetaminophen-Caffeine)     [name]    History   Social History  . Marital Status: Married    Spouse Name: N/A    Number of Children: N/A  . Years of Education: N/A   Occupational History  . Not on file.   Social History Main Topics  . Smoking status: Never Smoker   . Smokeless tobacco: Never Used  . Alcohol Use: No     Comment: RARE  . Drug Use: No  . Sexually Active: Not on file   Other Topics Concern  . Not on file   Social History Narrative  . No narrative on file    Family History   Problem Relation Age of Onset  . Heart disease Mother   . Hypertension Mother   . Cancer Mother   . Arthritis Mother   . Heart disease Father   . Early death Father   . Hypertension Father   . Hyperlipidemia Father   . Heart attack Father   . Sudden death Father     BP 131/92  Pulse 81  Ht 6\' 6"  (1.981 m)  Wt 237 lb (107.502 kg)  BMI 27.39 kg/m2  Review of Systems: See HPI above.    Objective:  Physical Exam:  Gen: NAD  Left ankle/foot: Mild swelling proximal 4th-5th metatarsal region.  No bruising other deformity. FROM ankle with 5/5 strength all directions without pain. TTP at swollen area noted above.  No other TTP about foot, ankle. Negative ant drawer and talar tilt.   Negative syndesmotic compression. Thompsons test negative. NV intact distally.  R knee: No gross deformity, ecchymoses, swelling.  Mild movement of patella from flexion to extension. No TTP currently - feels pain medial to patella just lateral to medial femoral condyle - no obvious plica here. FROM. Negative ant/post drawers. Negative valgus/varus testing. Negative lachmanns. Negative mcmurrays, apleys, patellar apprehension, clarkes. NV intact distally.    MSK u/s L foot: No  cortical irregularity, edema overlying cortices, increased neovascularity of 3rd-5th metatarsals, midfoot to suggest stress fracture, dislocation.  Assessment & Plan:  1. Left foot pain - Radiographs and ultrasound reassuring.  Consistent with intermetatarsal sprain.  Icing, tylenol, nsaids, compression, elevation.  Should improve over 3-4 weeks.  Consider postop shoe.  2. Right knee pain - Consistent with either patellofemoral syndrome or possibly a degenerative medial meniscal tear.  Burtis Junes the former though given location he usually gets pain.  Tylenol, nsaids, home exercise program (strengthening), arch supports, icing.  Consider injection, PT, MRI if not improving.

## 2012-08-11 NOTE — Assessment & Plan Note (Signed)
Consistent with either patellofemoral syndrome or possibly a degenerative medial meniscal tear.  Legrand Rams the former though given location he usually gets pain.  Tylenol, nsaids, home exercise program (strengthening), arch supports, icing.  Consider injection, PT, MRI if not improving.

## 2012-08-11 NOTE — Assessment & Plan Note (Signed)
Radiographs and ultrasound reassuring.  Consistent with intermetatarsal sprain.  Icing, tylenol, nsaids, compression, elevation.  Should improve over 3-4 weeks.  Consider postop shoe.

## 2012-08-21 ENCOUNTER — Encounter (HOSPITAL_BASED_OUTPATIENT_CLINIC_OR_DEPARTMENT_OTHER): Payer: Self-pay | Admitting: *Deleted

## 2012-08-21 ENCOUNTER — Emergency Department (HOSPITAL_BASED_OUTPATIENT_CLINIC_OR_DEPARTMENT_OTHER): Payer: Medicare Other

## 2012-08-21 ENCOUNTER — Emergency Department (HOSPITAL_BASED_OUTPATIENT_CLINIC_OR_DEPARTMENT_OTHER)
Admission: EM | Admit: 2012-08-21 | Discharge: 2012-08-21 | Disposition: A | Discharge status: Home / Self Care | Payer: Medicare Other | Attending: Emergency Medicine | Admitting: Emergency Medicine

## 2012-08-21 DIAGNOSIS — Z8709 Personal history of other diseases of the respiratory system: Secondary | ICD-10-CM | POA: Insufficient documentation

## 2012-08-21 DIAGNOSIS — M25461 Effusion, right knee: Secondary | ICD-10-CM

## 2012-08-21 DIAGNOSIS — S99919A Unspecified injury of unspecified ankle, initial encounter: Secondary | ICD-10-CM | POA: Insufficient documentation

## 2012-08-21 DIAGNOSIS — Y9301 Activity, walking, marching and hiking: Secondary | ICD-10-CM | POA: Insufficient documentation

## 2012-08-21 DIAGNOSIS — Y9289 Other specified places as the place of occurrence of the external cause: Secondary | ICD-10-CM | POA: Insufficient documentation

## 2012-08-21 DIAGNOSIS — S8990XA Unspecified injury of unspecified lower leg, initial encounter: Secondary | ICD-10-CM | POA: Insufficient documentation

## 2012-08-21 DIAGNOSIS — X500XXA Overexertion from strenuous movement or load, initial encounter: Secondary | ICD-10-CM | POA: Insufficient documentation

## 2012-08-21 DIAGNOSIS — Z872 Personal history of diseases of the skin and subcutaneous tissue: Secondary | ICD-10-CM | POA: Insufficient documentation

## 2012-08-21 DIAGNOSIS — M25469 Effusion, unspecified knee: Secondary | ICD-10-CM | POA: Insufficient documentation

## 2012-08-21 DIAGNOSIS — J45909 Unspecified asthma, uncomplicated: Secondary | ICD-10-CM | POA: Insufficient documentation

## 2012-08-21 MED ORDER — HYDROCODONE-ACETAMINOPHEN 5-325 MG PO TABS: 2.0000 | ORAL_TABLET | ORAL | Status: DC | PRN

## 2012-08-21 NOTE — ED Notes (Signed)
Right knee pain. No injury. Felt a pop while walking today.

## 2012-08-21 NOTE — ED Provider Notes (Signed)
Medical screening examination/treatment/procedure(s) were performed by non-physician practitioner and as supervising physician I was immediately available for consultation/collaboration.  Doug Sou, MD 08/21/12 [phone]

## 2012-08-21 NOTE — ED Notes (Signed)
Patient transported to & from X-ray. 

## 2012-08-21 NOTE — ED Provider Notes (Signed)
   History    CSN: [id number] Arrival date & time 08/21/12  66  First MD Initiated Contact with Patient 08/21/12 1812     Chief Complaint  Patient presents with  . Knee Pain   (Consider location/radiation/quality/duration/timing/severity/associated sxs/prior Treatment) Patient is a 42 y.o. male presenting with knee pain. The history is provided by the patient. No language interpreter was used.  Knee Pain Location:  Knee Knee location:  R knee Pain details:    Quality:  Aching   Radiates to:  Does not radiate   Severity:  Moderate   Timing:  Constant   Progression:  Worsening Chronicity:  New Dislocation: no   Foreign body present:  No foreign bodies Ineffective treatments:  None tried Pt reports knee popped while walking.   Pt has seen Dr. Barbaraann Barthel for the same,  Today pt's knee popped and now kneee is swollen Past Medical History  Diagnosis Date  . Pneumothorax   . Erythema multiforme   . Asthma   . 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 Biapical lung resection  2010  . Chest tube insertion    . Lymph gland excision     Family History  Problem Relation Age of Onset  . Heart disease Mother   . Hypertension Mother   . Cancer Mother   . Arthritis Mother   . Heart disease Father   . Early death Father   . Hypertension Father   . Hyperlipidemia Father   . Heart attack Father   . Sudden death Father    History  Substance Use Topics  . Smoking status: Never Smoker   . Smokeless tobacco: Never Used  . Alcohol Use: No     Comment: RARE    Review of Systems  All other systems reviewed and are negative.    Allergies  Aspirin and Excedrin migraine  Home Medications   Current Outpatient Rx  Name  Route  Sig  Dispense  Refill  . HYDROcodone-acetaminophen (NORCO/VICODIN) 5-325 MG per tablet   Oral   Take 2 tablets by mouth every 4 (four) hours as needed.   20 tablet   0    BP 136/78   Pulse 66  Temp(Src) 98 F (36.7 C) (Oral)  Resp 18  Ht 6\' 6"  (1.981 m)  Wt 235 lb (106.595 kg)  BMI 27.16 kg/m2  SpO2 95% Physical Exam  Nursing note and vitals reviewed. Constitutional: He is oriented to person, place, and time. He appears well-developed and well-nourished.  Musculoskeletal: He exhibits tenderness.  Neurological: He is alert and oriented to person, place, and time. He has normal reflexes.  Skin: Skin is warm.  Psychiatric: He has a normal mood and affect.    ED Course  Procedures (including critical care time) Labs Reviewed - No data to display Dg Knee Complete 4 Views Right  08/21/2012   *RADIOLOGY REPORT*  Clinical Data: Knee pain, popping sensation  RIGHT KNEE - COMPLETE 4+ VIEW  Comparison: None.  Findings: Minimal tibial spine spurring.  No fracture or dislocation.  No significant joint effusion.  No radiopaque foreign body.  IMPRESSION: No acute osseous abnormality.   Original Report Authenticated By: Conchita Paris, M.D.   1. Knee effusion, right     MDM  Knee imbolizerer, follow up with Dr. Bradd Burner, PA-C 08/21/12 1842

## 2012-08-26 ENCOUNTER — Encounter: Payer: Self-pay | Admitting: Family Medicine

## 2012-08-26 ENCOUNTER — Ambulatory Visit (INDEPENDENT_AMBULATORY_CARE_PROVIDER_SITE_OTHER): Payer: Medicare Other | Admitting: Family Medicine

## 2012-08-26 VITALS — BP 120/84 | HR 74 | Ht 78.0 in | Wt 236.0 lb

## 2012-08-26 DIAGNOSIS — M25561 Pain in right knee: Secondary | ICD-10-CM

## 2012-08-26 DIAGNOSIS — M25569 Pain in unspecified knee: Secondary | ICD-10-CM

## 2012-08-27 ENCOUNTER — Encounter: Payer: Self-pay | Admitting: Family Medicine

## 2012-08-27 NOTE — Progress Notes (Signed)
Patient ID: Francisco Lawson, male   DOB: 07-28-1970, 42 y.o.   MRN: [id number]  PCP: Dr. Laney Pastor  Subjective:   HPI: Patient is a 42 y.o. male here for f/u right knee pain.  7/3: 1. Left foot pain  Patient reports he had some lateral left foot pain leading up to current injury. Then went to jump out of bed on 7/1 and felt a sharp pain, pop lateral aspect of foot. Associated swelling. Had x-rays that were negative in ED. Has been elevating, using ace wrap. Taking norco as needed for pain.  2. Right knee pain Reports intermittent problems with this. Has popping and occasional swelling of knee. Recalls turning his knee once when foot was planted. No locking, giving out. No prior treatment  7/22: Patient returns with right knee pain. States on 7/17 he was walking forward holding his child when he felt right knee pop anteriorly with a sharp pain. This improved rather quickly. Went to ED because of the pain and swelling - told he had an effusion and to follow-up with [place]. Has been icing, elevating leg. Doing HEP from last visit. No locking but having some catching.  Past Medical History  Diagnosis Date  . Pneumothorax   . Erythema multiforme   . Asthma   . Stevens-Johnson syndrome     from ASA and NSAIDs    Current Outpatient Prescriptions on File Prior to Visit  Medication Sig Dispense Refill  . HYDROcodone-acetaminophen (NORCO/VICODIN) 5-325 MG per tablet Take 2 tablets by mouth every 4 (four) hours as needed.  20 tablet  0   No current facility-administered medications on file prior to visit.    Past Surgical History  Procedure Laterality Date  . Biapical lung resection  2010  . Chest tube insertion    . Lymph gland excision      Allergies  Allergen Reactions  . Aspirin     [name]  . Excedrin Migraine (Aspirin-Acetaminophen-Caffeine)     [name]    History   Social History  . Marital Status: Married    Spouse Name: N/A    Number of  Children: N/A  . Years of Education: N/A   Occupational History  . Not on file.   Social History Main Topics  . Smoking status: Never Smoker   . Smokeless tobacco: Never Used  . Alcohol Use: No     Comment: RARE  . Drug Use: No  . Sexually Active: Not on file   Other Topics Concern  . Not on file   Social History Narrative  . No narrative on file    Family History  Problem Relation Age of Onset  . Heart disease Mother   . Hypertension Mother   . Cancer Mother   . Arthritis Mother   . Heart disease Father   . Early death Father   . Hypertension Father   . Hyperlipidemia Father   . Heart attack Father   . Sudden death Father     BP 120/84  Pulse 74  Ht 6\' 6"  (1.981 m)  Wt 236 lb (107.049 kg)  BMI 27.28 kg/m2  Review of Systems: See HPI above.    Objective:  Physical Exam:  Gen: NAD  R knee: No gross deformity, ecchymoses, effusion.  Mild movement of patella from flexion to extension. Mild TTP medial joint line.  No lateral joint line, post patellar facet TTP currently. FROM. Negative ant/post drawers. Negative valgus/varus testing. Negative lachmanns. Pain mildly medially with mcmurrays, apleys.  Negative patellar apprehension. NV intact distally.  Assessment & Plan:  1. Right knee pain - Still believe he has patellofemoral syndrome but exam today is more concerning for an additional medial meniscal tear.  At this time will continue with conservative care.  But if he gets worsening catching or the knee locks, pain persists despite home exercise program over next 4 weeks advised would consider injection or MRI to assess for tear, possible arthroscopy.  In meantime tylenol, nsaids, icing, home exercises.

## 2012-08-27 NOTE — Assessment & Plan Note (Signed)
Still believe he has patellofemoral syndrome but exam today is more concerning for an additional medial meniscal tear.  At this time will continue with conservative care.  But if he gets worsening catching or the knee locks, pain persists despite home exercise program over next 4 weeks advised would consider injection or MRI to assess for tear, possible arthroscopy.  In meantime tylenol, nsaids, icing, home exercises.

## 2012-09-04 ENCOUNTER — Ambulatory Visit: Payer: Medicare Other | Admitting: Family Medicine

## 2012-09-24 ENCOUNTER — Encounter: Payer: Self-pay | Admitting: Family Medicine

## 2012-09-24 ENCOUNTER — Ambulatory Visit (INDEPENDENT_AMBULATORY_CARE_PROVIDER_SITE_OTHER): Payer: Medicare Other | Admitting: Family Medicine

## 2012-09-24 VITALS — BP 121/85 | HR 84 | Ht 78.0 in | Wt 230.0 lb

## 2012-09-24 DIAGNOSIS — M25561 Pain in right knee: Secondary | ICD-10-CM

## 2012-09-24 DIAGNOSIS — M25569 Pain in unspecified knee: Secondary | ICD-10-CM

## 2012-09-25 ENCOUNTER — Encounter: Payer: Self-pay | Admitting: Family Medicine

## 2012-09-25 NOTE — Assessment & Plan Note (Signed)
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  Continue HEP for most days of the week next 6 weeks.  Tylenol, nsaids, icing as needed.  F/u prn.

## 2012-09-25 NOTE — Progress Notes (Signed)
Patient ID: Francisco Lawson, male   DOB: 1970/10/05, 42 y.o.   MRN: [id number]  PCP: Dr. Laney Pastor  Subjective:   HPI: Patient is a 42 y.o. male here for f/u right knee pain.  7/3: 1. Left foot pain  Patient reports he had some lateral left foot pain leading up to current injury. Then went to jump out of bed on 7/1 and felt a sharp pain, pop lateral aspect of foot. Associated swelling. Had x-rays that were negative in ED. Has been elevating, using ace wrap. Taking norco as needed for pain.  2. Right knee pain Reports intermittent problems with this. Has popping and occasional swelling of knee. Recalls turning his knee once when foot was planted. No locking, giving out. No prior treatment  7/22: Patient returns with right knee pain. States on 7/17 he was walking forward holding his child when he felt right knee pop anteriorly with a sharp pain. This improved rather quickly. Went to ED because of the pain and swelling - told he had an effusion and to follow-up with [place]. Has been icing, elevating leg. Doing HEP from last visit. No locking but having some catching.  8/20: Patient states he's doing significantly better. Not having many problems - just some tension at times in the knee but no pain. Minimal swelling. Hasn't tried running yet. Takes ibuprofen occasionally. Doing home exercises every day.  Past Medical History  Diagnosis Date  . Pneumothorax   . Erythema multiforme   . Asthma   . Stevens-Johnson syndrome     from ASA and NSAIDs    Current Outpatient Prescriptions on File Prior to Visit  Medication Sig Dispense Refill  . HYDROcodone-acetaminophen (NORCO/VICODIN) 5-325 MG per tablet Take 2 tablets by mouth every 4 (four) hours as needed.  20 tablet  0   No current facility-administered medications on file prior to visit.    Past Surgical History  Procedure Laterality Date  . Biapical lung resection  2010  . Chest tube insertion    . Lymph gland  excision      Allergies  Allergen Reactions  . Aspirin     [name]  . Excedrin Migraine [Aspirin-Acetaminophen-Caffeine]     [name]    History   Social History  . Marital Status: Married    Spouse Name: N/A    Number of Children: N/A  . Years of Education: N/A   Occupational History  . Not on file.   Social History Main Topics  . Smoking status: Never Smoker   . Smokeless tobacco: Never Used  . Alcohol Use: No     Comment: RARE  . Drug Use: No  . Sexual Activity: Not on file   Other Topics Concern  . Not on file   Social History Narrative  . No narrative on file    Family History  Problem Relation Age of Onset  . Heart disease Mother   . Hypertension Mother   . Cancer Mother   . Arthritis Mother   . Heart disease Father   . Early death Father   . Hypertension Father   . Hyperlipidemia Father   . Heart attack Father   . Sudden death Father     BP 121/85  Pulse 84  Ht 6\' 6"  (1.981 m)  Wt 230 lb (104.327 kg)  BMI 26.58 kg/m2  Review of Systems: See HPI above.    Objective:  Physical Exam:  Gen: NAD  R knee: No gross deformity, ecchymoses, effusion.  Mild movement of patella from flexion to extension. No TTP medial joint line.  No lateral joint line, post patellar facet TTP currently. FROM. Negative ant/post drawers. Negative valgus/varus testing. Negative lachmanns. Negative mcmurrays, apleys.  Negative patellar apprehension. NV intact distally.  Assessment & Plan:  1. 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  Continue HEP for most days of the week next 6 weeks.  Tylenol, nsaids, icing as needed.  F/u prn.

## 2012-09-25 NOTE — Patient Instructions (Addendum)
Advised to continue HEP for 6 more weeks, most days of the week. Follow up as needed.

## 2013-02-25 ENCOUNTER — Emergency Department (HOSPITAL_BASED_OUTPATIENT_CLINIC_OR_DEPARTMENT_OTHER)
Admission: EM | Admit: 2013-02-25 | Discharge: 2013-02-25 | Disposition: A | Discharge status: Home / Self Care | Payer: Medicare Other | Attending: Emergency Medicine | Admitting: Emergency Medicine

## 2013-02-25 ENCOUNTER — Encounter (HOSPITAL_BASED_OUTPATIENT_CLINIC_OR_DEPARTMENT_OTHER): Payer: Self-pay | Admitting: Emergency Medicine

## 2013-02-25 DIAGNOSIS — J111 Influenza due to unidentified influenza virus with other respiratory manifestations: Secondary | ICD-10-CM | POA: Insufficient documentation

## 2013-02-25 DIAGNOSIS — Z872 Personal history of diseases of the skin and subcutaneous tissue: Secondary | ICD-10-CM | POA: Insufficient documentation

## 2013-02-25 DIAGNOSIS — J45901 Unspecified asthma with (acute) exacerbation: Secondary | ICD-10-CM | POA: Insufficient documentation

## 2013-02-25 MED ORDER — AZITHROMYCIN 250 MG PO TABS: ORAL_TABLET | ORAL | Status: DC

## 2013-02-25 MED ORDER — HYDROCODONE-HOMATROPINE 5-1.5 MG/5ML PO SYRP: 5.0000 mL | ORAL_SOLUTION | Freq: Four times a day (QID) | ORAL | Status: DC | PRN

## 2013-02-25 NOTE — Discharge Instructions (Signed)

## 2013-02-25 NOTE — ED Provider Notes (Signed)
CSN: [id number]     Arrival date & time 02/25/13  0840 History   First MD Initiated Contact with Patient 02/25/13 [phone]     Chief Complaint  Patient presents with  . URI    HPI Four-day history of bodyaches, fever, cough and congestion.  Denies nausea vomiting diarrhea.  Some scattered wheezes.  History of asthma. Past Medical History  Diagnosis Date  . Pneumothorax   . Erythema multiforme   . Asthma   . 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 Biapical lung resection  2010  . Chest tube insertion    . Lymph gland excision     Family History  Problem Relation Age of Onset  . Heart disease Mother   . Hypertension Mother   . Cancer Mother   . Arthritis Mother   . Heart disease Father   . Early death Father   . Hypertension Father   . Hyperlipidemia Father   . Heart attack Father   . Sudden death Father    History  Substance Use Topics  . Smoking status: Never Smoker   . Smokeless tobacco: Never Used  . Alcohol Use: No     Comment: RARE    Review of Systems All other systems reviewed and are nega Allergies  Aspirin and Excedrin migraine  Home Medications   Current Outpatient Rx  Name  Route  Sig  Dispense  Refill  . azithromycin (ZITHROMAX) 250 MG tablet      Take 2 tablets on day one then one tablet a day until gone   6 each   0   . HYDROcodone-acetaminophen (NORCO/VICODIN) 5-325 MG per tablet   Oral   Take 2 tablets by mouth every 4 (four) hours as needed.   20 tablet   0   . HYDROcodone-homatropine (HYCODAN) 5-1.5 MG/5ML syrup   Oral   Take 5 mLs by mouth every 6 (six) hours as needed for cough.   120 mL   0    BP 136/91  Pulse 80  Temp(Src) 98 F (36.7 C) (Oral)  Resp 20  Ht 6\' 5"  (1.956 m)  Wt 236 lb (107.049 kg)  BMI 27.98 kg/m2  SpO2 98% Physical Exam  Nursing note and vitals reviewed. Constitutional: He is oriented to person, place, and time. He appears well-developed  and well-nourished. No distress.  HENT:  Head: Normocephalic and atraumatic.  Eyes: Pupils are equal, round, and reactive to light.  Neck: Normal range of motion.  Cardiovascular: Normal rate and intact distal pulses.   Pulmonary/Chest: No respiratory distress. He has no wheezes.  Abdominal: Normal appearance. He exhibits no distension. There is no tenderness.  Musculoskeletal: Normal range of motion.  Neurological: He is alert and oriented to person, place, and time. No cranial nerve deficit.  Skin: Skin is warm and dry. No rash noted.  Psychiatric: He has a normal mood and affect. His behavior is normal.   tive ED Course  Procedures (including critical care time) Labs Review Labs Reviewed - No data to display Imaging Review No results found.    MDM   1. Influenza       Dot Lanes, MD 02/25/13 [phone]

## 2013-02-25 NOTE — ED Notes (Signed)
MD at bedside. 

## 2013-02-25 NOTE — ED Notes (Signed)
Pt reports several days of intermittent body aches, cough/congestion.  Denies n/v/d.  Had fever initially but not current.

## 2013-04-12 ENCOUNTER — Emergency Department (HOSPITAL_BASED_OUTPATIENT_CLINIC_OR_DEPARTMENT_OTHER)
Admission: EM | Admit: 2013-04-12 | Discharge: 2013-04-13 | Disposition: A | Discharge status: Home / Self Care | Payer: Medicare Other | Attending: Emergency Medicine | Admitting: Emergency Medicine

## 2013-04-12 ENCOUNTER — Emergency Department (HOSPITAL_BASED_OUTPATIENT_CLINIC_OR_DEPARTMENT_OTHER): Payer: Medicare Other

## 2013-04-12 ENCOUNTER — Encounter (HOSPITAL_BASED_OUTPATIENT_CLINIC_OR_DEPARTMENT_OTHER): Payer: Self-pay | Admitting: Emergency Medicine

## 2013-04-12 DIAGNOSIS — Y929 Unspecified place or not applicable: Secondary | ICD-10-CM | POA: Insufficient documentation

## 2013-04-12 DIAGNOSIS — J45909 Unspecified asthma, uncomplicated: Secondary | ICD-10-CM | POA: Insufficient documentation

## 2013-04-12 DIAGNOSIS — S46919A Strain of unspecified muscle, fascia and tendon at shoulder and upper arm level, unspecified arm, initial encounter: Secondary | ICD-10-CM

## 2013-04-12 DIAGNOSIS — X500XXA Overexertion from strenuous movement or load, initial encounter: Secondary | ICD-10-CM | POA: Insufficient documentation

## 2013-04-12 DIAGNOSIS — Z8709 Personal history of other diseases of the respiratory system: Secondary | ICD-10-CM | POA: Insufficient documentation

## 2013-04-12 DIAGNOSIS — Z872 Personal history of diseases of the skin and subcutaneous tissue: Secondary | ICD-10-CM | POA: Insufficient documentation

## 2013-04-12 DIAGNOSIS — J3489 Other specified disorders of nose and nasal sinuses: Secondary | ICD-10-CM | POA: Insufficient documentation

## 2013-04-12 DIAGNOSIS — IMO0002 Reserved for concepts with insufficient information to code with codable children: Secondary | ICD-10-CM | POA: Insufficient documentation

## 2013-04-12 DIAGNOSIS — Y9389 Activity, other specified: Secondary | ICD-10-CM | POA: Insufficient documentation

## 2013-04-12 MED ORDER — GI COCKTAIL ~~LOC~~
30.0000 mL | Freq: Once | ORAL | Status: AC
  Administered 2013-04-12: 30 mL via ORAL

## 2013-04-12 MED ORDER — GI COCKTAIL ~~LOC~~
ORAL | Status: DC
Start: 2013-04-12 — End: 2013-04-13
  Filled 2013-04-12: qty 30

## 2013-04-12 MED ORDER — NAPROXEN 250 MG PO TABS
ORAL_TABLET | ORAL | Status: AC
  Filled 2013-04-12: qty 2

## 2013-04-12 NOTE — ED Provider Notes (Signed)
CSN: [id number]     Arrival date & time 04/12/13  2110 History   This chart was scribed for Francisco Baumert Alfonso Patten, MD by Era Bumpers, ED scribe. This patient was seen in room MH12/MH12 and the patient's care was started at 2110.  Chief Complaint  Patient presents with  . Shoulder Pain  . Nasal Congestion   Patient is a 43 y.o. male presenting with shoulder injury. The history is provided by the patient. No language interpreter was used.  Shoulder Injury This is a new problem. The current episode started yesterday. The problem occurs constantly. The problem has not changed since onset.Pertinent negatives include no chest pain, no abdominal pain, no headaches and no shortness of breath. Nothing aggravates the symptoms. Nothing relieves the symptoms. He has tried nothing for the symptoms. The treatment provided no relief.   HPI Comments: Francisco Lawson is a 43 y.o. male who presents to the Emergency Department complaining of sudden onset sharp right shoulder pain after she hugged somebody today and heard a popping noise and also c/o throat  congestion ongoing for 6 months but off and on for years and states there is a "gurgling sensation in my throat".He reports no relief w/mucinex which he has been taking for the past week. He has seen his PCP for this problem and was previously tx w/AMX but currently taking no medicine. No recent long plane or car trips.   Past Medical History  Diagnosis Date  . Pneumothorax   . Erythema multiforme   . Asthma   . 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 Biapical lung resection  2010  . Chest tube insertion    . Lymph gland excision     Family History  Problem Relation Age of Onset  . Heart disease Mother   . Hypertension Mother   . Cancer Mother   . Arthritis Mother   . Heart disease Father   . Early death Father   . Hypertension Father   . Hyperlipidemia Father   . Heart attack Father    . Sudden death Father    History  Substance Use Topics  . Smoking status: Never Smoker   . Smokeless tobacco: Never Used  . Alcohol Use: No     Comment: RARE    Review of Systems  Constitutional: Negative for fever and chills.  HENT: Positive for congestion.   Respiratory: Negative for cough and shortness of breath.   Cardiovascular: Negative for chest pain.  Gastrointestinal: Negative for abdominal pain.  Musculoskeletal: Negative for back pain.       Right shoulder pain  Neurological: Negative for headaches.  All other systems reviewed and are negative.    Allergies  Aspirin and Excedrin migraine  Home Medications   Current Outpatient Rx  Name  Route  Sig  Dispense  Refill  . azithromycin (ZITHROMAX) 250 MG tablet      Take 2 tablets on day one then one tablet a day until gone   6 each   0   . HYDROcodone-acetaminophen (NORCO/VICODIN) 5-325 MG per tablet   Oral   Take 2 tablets by mouth every 4 (four) hours as needed.   20 tablet   0   . HYDROcodone-homatropine (HYCODAN) 5-1.5 MG/5ML syrup   Oral   Take 5 mLs by mouth every 6 (six) hours as needed for cough.   120 mL   0    BP  138/88  Temp(Src) 97.9 F (36.6 C) (Oral)  Resp 18  Ht 6\' 6"  (1.981 m)  Wt 236 lb (107.049 kg)  BMI 27.28 kg/m2  SpO2 95% Physical Exam  Nursing note and vitals reviewed. Constitutional: He is oriented to person, place, and time. He appears well-developed and well-nourished. No distress.  HENT:  Head: Normocephalic and atraumatic.  Mouth/Throat: Oropharynx is clear and moist. No oropharyngeal exudate.  Eyes: Conjunctivae are normal. Pupils are equal, round, and reactive to light. Right eye exhibits no discharge. Left eye exhibits no discharge.  Neck: Normal range of motion. Neck supple. No tracheal deviation present.  Cardiovascular: Normal rate, regular rhythm, normal heart sounds and intact distal pulses.   No murmur heard. Pulmonary/Chest: Effort normal and breath  sounds normal. No stridor. No respiratory distress. He has no wheezes. He has no rales.  Abdominal: Soft. Bowel sounds are normal. There is no tenderness. There is no rebound and no guarding.  Musculoskeletal: Normal range of motion. He exhibits no edema and no tenderness.  Intact brachialis, deltoid, biceps and triceps Cap refill less 2 seconds NV intact right hand   Negative neers tests on the right  Lymphadenopathy:    He has no cervical adenopathy.  Neurological: He is alert and oriented to person, place, and time. He has normal reflexes.  Skin: Skin is warm and dry.  Psychiatric: He has a normal mood and affect. Thought content normal.    ED Course  Procedures (including critical care time) DIAGNOSTIC STUDIES: Oxygen Saturation is 95% on room air, adequate by my interpretation.    COORDINATION OF CARE:   Labs Review Labs Reviewed - No data to display Imaging Review No results found.   EKG Interpretation None      MDM   Final diagnoses:  None    Will treat shoulder strain with short course of pain medication and muscles relaxant.  Throat congestion is long standing and patient will need to follow up with his PMD or ENT   I personally performed the services described in this documentation, which was scribed in my presence. The recorded information has been reviewed and is accurate.      Carlisle Beers, MD 04/13/13 [phone]

## 2013-04-12 NOTE — ED Notes (Signed)
Patient states that last night he lefted his right arm and felt a "pop". Ever since he has had pain in that shoulder. Also states that he feels congestion in his upper epigastric region. Also states that he has pain in hhis chest when lays down at night and the only way to relieve it is to lay on his side.

## 2013-04-13 MED ORDER — HYDROCODONE-ACETAMINOPHEN 5-325 MG PO TABS: 1.0000 | ORAL_TABLET | Freq: Four times a day (QID) | ORAL | Status: DC | PRN

## 2013-04-13 MED ORDER — METHOCARBAMOL 500 MG PO TABS: 500.0000 mg | ORAL_TABLET | Freq: Two times a day (BID) | ORAL | Status: DC

## 2013-06-26 ENCOUNTER — Emergency Department (HOSPITAL_BASED_OUTPATIENT_CLINIC_OR_DEPARTMENT_OTHER): Payer: Medicare Other

## 2013-06-26 ENCOUNTER — Encounter (HOSPITAL_BASED_OUTPATIENT_CLINIC_OR_DEPARTMENT_OTHER): Payer: Self-pay | Admitting: Emergency Medicine

## 2013-06-26 ENCOUNTER — Emergency Department (HOSPITAL_BASED_OUTPATIENT_CLINIC_OR_DEPARTMENT_OTHER)
Admission: EM | Admit: 2013-06-26 | Discharge: 2013-06-26 | Disposition: A | Discharge status: Home / Self Care | Payer: Medicare Other | Attending: Emergency Medicine | Admitting: Emergency Medicine

## 2013-06-26 DIAGNOSIS — J45909 Unspecified asthma, uncomplicated: Secondary | ICD-10-CM | POA: Insufficient documentation

## 2013-06-26 DIAGNOSIS — Z79899 Other long term (current) drug therapy: Secondary | ICD-10-CM | POA: Insufficient documentation

## 2013-06-26 DIAGNOSIS — Z7901 Long term (current) use of anticoagulants: Secondary | ICD-10-CM | POA: Insufficient documentation

## 2013-06-26 DIAGNOSIS — Z792 Long term (current) use of antibiotics: Secondary | ICD-10-CM | POA: Insufficient documentation

## 2013-06-26 DIAGNOSIS — J40 Bronchitis, not specified as acute or chronic: Secondary | ICD-10-CM

## 2013-06-26 DIAGNOSIS — Z872 Personal history of diseases of the skin and subcutaneous tissue: Secondary | ICD-10-CM | POA: Insufficient documentation

## 2013-06-26 MED ORDER — AEROCHAMBER PLUS FLO-VU MEDIUM MISC
1.0000 | Freq: Once | Status: DC
  Filled 2013-06-26: qty 1

## 2013-06-26 MED ORDER — PREDNISONE 20 MG PO TABS
40.0000 mg | ORAL_TABLET | Freq: Once | ORAL | Status: AC
  Administered 2013-06-26: 40 mg via ORAL
  Filled 2013-06-26: qty 2

## 2013-06-26 MED ORDER — PREDNISONE 20 MG PO TABS: 40.0000 mg | ORAL_TABLET | Freq: Every day | ORAL | Status: DC

## 2013-06-26 MED ORDER — ALBUTEROL SULFATE HFA 108 (90 BASE) MCG/ACT IN AERS
1.0000 | INHALATION_SPRAY | Freq: Once | RESPIRATORY_TRACT | Status: AC
  Administered 2013-06-26: 2 via RESPIRATORY_TRACT
  Filled 2013-06-26: qty 6.7

## 2013-06-26 MED ORDER — PREDNISONE 10 MG PO TABS: 20.0000 mg | ORAL_TABLET | Freq: Every day | ORAL | Status: DC

## 2013-06-26 NOTE — ED Notes (Signed)
Pt amb to room 6 with quick steady gait in nad. Pt reports nasal congestion and cough x Wednesday. Denies any fevers.

## 2013-06-26 NOTE — Discharge Instructions (Signed)

## 2013-06-26 NOTE — ED Provider Notes (Signed)
CSN: [id number]     Arrival date & time 06/26/13  1325 History   First MD Initiated Contact with Patient 06/26/13 1347     Chief Complaint  Patient presents with  . Nasal Congestion  . Cough     (Consider location/radiation/quality/duration/timing/severity/associated sxs/prior Treatment) Patient is a 43 y.o. male presenting with cough. The history is provided by the patient.  Cough Cough characteristics:  Productive Sputum characteristics:  Nondescript and yellow Severity:  Moderate Onset quality:  Gradual Timing:  Intermittent Progression:  Unchanged Chronicity:  New Smoker: no   Context: upper respiratory infection and weather changes   Context: not animal exposure, not exposure to allergens, not fumes, not occupational exposure, not sick contacts and not smoke exposure   Relieved by:  Nothing Worsened by:  Nothing tried Ineffective treatments:  None tried Associated symptoms: rhinorrhea and sinus congestion   Associated symptoms: no chest pain     Past Medical History  Diagnosis Date  . Pneumothorax   . Erythema multiforme   . Asthma   . 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 Biapical lung resection  2010  . Chest tube insertion    . Lymph gland excision     Family History  Problem Relation Age of Onset  . Heart disease Mother   . Hypertension Mother   . Cancer Mother   . Arthritis Mother   . Heart disease Father   . Early death Father   . Hypertension Father   . Hyperlipidemia Father   . Heart attack Father   . Sudden death Father    History  Substance Use Topics  . Smoking status: Never Smoker   . Smokeless tobacco: Never Used  . Alcohol Use: No     Comment: RARE    Review of Systems  HENT: Positive for rhinorrhea.   Respiratory: Positive for cough.   Cardiovascular: Negative for chest pain.  All other systems reviewed and are negative.     Allergies  Aspirin and Excedrin  migraine  Home Medications   Prior to Admission medications   Medication Sig Start Date End Date Taking? Authorizing Provider  azithromycin (ZITHROMAX) 250 MG tablet Take 2 tablets on day one then one tablet a day until gone 02/25/13   Dot Lanes, MD  HYDROcodone-acetaminophen (NORCO) 5-325 MG per tablet Take 1 tablet by mouth every 6 (six) hours as needed. 04/13/13   April K Palumbo-Rasch, MD  HYDROcodone-acetaminophen (NORCO/VICODIN) 5-325 MG per tablet Take 2 tablets by mouth every 4 (four) hours as needed. 08/21/12   Fransico Meadow, PA-C  HYDROcodone-homatropine [facility]) 5-1.5 MG/5ML syrup Take 5 mLs by mouth every 6 (six) hours as needed for cough. 02/25/13   Dot Lanes, MD  methocarbamol (ROBAXIN) 500 MG tablet Take 1 tablet (500 mg total) by mouth 2 (two) times daily. 04/13/13   April K Palumbo-Rasch, MD   BP 152/89  Pulse 78  Temp(Src) 98.2 F (36.8 C) (Oral)  Resp 16  Ht 6\' 6"  (1.981 m)  Wt 236 lb (107.049 kg)  BMI 27.28 kg/m2  SpO2 100% Physical Exam  Nursing note and vitals reviewed. Constitutional: He is oriented to person, place, and time. He appears well-developed and well-nourished.  HENT:  Head: Normocephalic and atraumatic.  Right Ear: External ear normal.  Left Ear: External ear normal.  Nose: Nose normal.  Mouth/Throat: Oropharynx is clear and moist.  Eyes: Conjunctivae and EOM are  normal. Pupils are equal, round, and reactive to light.  Neck: Normal range of motion. Neck supple.  Cardiovascular: Normal rate, regular rhythm, normal heart sounds and intact distal pulses.   Pulmonary/Chest: Effort normal. He has rales.  Abdominal: Soft.  Musculoskeletal: Normal range of motion.  Neurological: He is alert and oriented to person, place, and time. He has normal reflexes.  Skin: Skin is warm and dry.  Psychiatric: He has a normal mood and affect. His behavior is normal. Judgment and thought content normal.    ED Course  Procedures (including critical care  time) Labs Review Labs Reviewed - No data to display  Imaging Review Dg Chest 2 View  06/26/2013   CLINICAL DATA:  Cough and congestion  EXAM: CHEST  2 VIEW  COMPARISON:  04/12/2013  FINDINGS: Cardiac shadow is stable. Scarring is again noted in the bases bilaterally. Lungs are well-aerated without focal infiltrate. Mild apical changes are noted which are stable. No bony abnormality is seen.  IMPRESSION: No acute abnormality is noted.   Electronically Signed   By: Inez Catalina M.D.   On: 06/26/2013 14:18     EKG Interpretation None      MDM   Final diagnoses:  None    43 y.o male with uri symptoms with bronchitis and wheezing.  Patient is not a smoker.  I am treating with bronchodilators and prednisone.  Discussed with patient follow up and return precautions and voices understanding.    Shaune Pollack, MD 06/26/13 [phone]

## 2013-09-14 ENCOUNTER — Emergency Department (HOSPITAL_BASED_OUTPATIENT_CLINIC_OR_DEPARTMENT_OTHER)
Admission: EM | Admit: 2013-09-14 | Discharge: 2013-09-14 | Disposition: A | Discharge status: Home / Self Care | Payer: Medicare Other | Attending: Emergency Medicine | Admitting: Emergency Medicine

## 2013-09-14 ENCOUNTER — Encounter (HOSPITAL_BASED_OUTPATIENT_CLINIC_OR_DEPARTMENT_OTHER): Payer: Self-pay | Admitting: Emergency Medicine

## 2013-09-14 DIAGNOSIS — L988 Other specified disorders of the skin and subcutaneous tissue: Secondary | ICD-10-CM | POA: Insufficient documentation

## 2013-09-14 DIAGNOSIS — R238 Other skin changes: Secondary | ICD-10-CM

## 2013-09-14 DIAGNOSIS — L02419 Cutaneous abscess of limb, unspecified: Secondary | ICD-10-CM | POA: Diagnosis present

## 2013-09-14 DIAGNOSIS — J45909 Unspecified asthma, uncomplicated: Secondary | ICD-10-CM | POA: Insufficient documentation

## 2013-09-14 DIAGNOSIS — L03119 Cellulitis of unspecified part of limb: Secondary | ICD-10-CM

## 2013-09-14 NOTE — ED Provider Notes (Signed)
Medical screening examination/treatment/procedure(s) were conducted as a shared visit with non-physician practitioner(s) and myself.  I personally evaluated the patient during the encounter.  2 separate right posterior thigh (less than 5 mm) erythematous papules localized tender without surrounding cellulitis without purulence and without subcutaneous fluid collection noted on POCUS; doubt abscesses   Hurman HornJohn M Jalene Demo, MD 09/22/13 1328

## 2013-09-14 NOTE — ED Provider Notes (Signed)
CSN: [id number]     Arrival date & time 09/14/13  4098 History   First MD Initiated Contact with Patient 09/14/13 [phone]     Chief Complaint  Patient presents with  . Abscess     (Consider location/radiation/quality/duration/timing/severity/associated sxs/prior Treatment) HPI Francisco Lawson is a 43 year old male with past medical history of asthma, Stevens-Johnson syndrome secondary to aspirin and NSAIDs and multiple skin infections who presents with 2 days of irritation on his posterior right leg. Patient states he noticed these areas while in the shower Saturday evening and reports feeling 2 different locations that were tender to palpation. He states he has had abscess in the past which have required incision and drainage, and he states since these 2 areas have not gotten better since Saturday he wanted to have them evaluated today.  He denies fever, chills, drainage from the areas, nausea, vomiting, shortness of breath, chest pain.  Past Medical History  Diagnosis Date  . Pneumothorax   . Erythema multiforme   . Asthma   . 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 Biapical lung resection  2010  . Chest tube insertion    . Lymph gland excision     Family History  Problem Relation Age of Onset  . Heart disease Mother   . Hypertension Mother   . Cancer Mother   . Arthritis Mother   . Heart disease Father   . Early death Father   . Hypertension Father   . Hyperlipidemia Father   . Heart attack Father   . Sudden death Father    History  Substance Use Topics  . Smoking status: Never Smoker   . Smokeless tobacco: Never Used  . Alcohol Use: No     Comment: RARE    Review of Systems  Constitutional: Negative for fever, chills and activity change.  HENT: Negative for trouble swallowing.   Eyes: Negative for visual disturbance.  Respiratory: Negative for shortness of breath.   Cardiovascular: Negative for chest  pain and leg swelling.  Gastrointestinal: Negative for nausea and vomiting.  Genitourinary: Negative for dysuria.  Musculoskeletal: Negative for arthralgias and back pain.  Skin: Negative for rash.  Allergic/Immunologic: Negative for immunocompromised state.  Neurological: Negative for dizziness, syncope, weakness and numbness.  Hematological: Negative for adenopathy.  Psychiatric/Behavioral: Negative.       Allergies  Aspirin and Excedrin migraine  Home Medications   Prior to Admission medications   Not on File   BP 132/87  Pulse 80  Temp(Src) 97.8 F (36.6 C) (Oral)  Resp 16  Ht 6\' 6"  (1.981 m)  Wt 236 lb (107.049 kg)  BMI 27.28 kg/m2  SpO2 100% Physical Exam  Constitutional: He is oriented to person, place, and time. He appears well-developed and well-nourished. No distress.  HENT:  Head: Normocephalic and atraumatic.  Eyes: Pupils are equal, round, and reactive to light. No scleral icterus.  Neck: Normal range of motion.  Cardiovascular: Normal rate and regular rhythm.   Pulmonary/Chest: Effort normal.  Abdominal: There is no guarding.  Musculoskeletal: Normal range of motion.  Neurological: He is alert and oriented to person, place, and time.  Skin: Skin is warm, dry and intact. He is not diaphoretic.  2, non-purulent, 1-2 mm lesions noted on patient's posterior right leg. No swelling, erythema, induration noted to either lesion.  Psychiatric: He has a normal mood and affect.    ED Course  Procedures (including critical  care time) Labs Review Labs Reviewed - No data to display  Imaging Review No results found.   EKG Interpretation None      MDM   Final diagnoses:  Papules    43 year old male with history of abscess presenting with 2 days of painful area is posterior right thigh.  0845 Ultrasound performed of patient's posterior right side. Layers of skin and fascia are normal in appearance with no pockets or areas of anechoic subcutaneous fluid  collection.  Patient given instructions to take hot showers, use warm compresses. Patient also informed that affected areas may still potentially become abscesses in the next few days. We encouraged patient to keep an eye on these areas, and return to the ER  should these areas become worse, or should he have any questions or concerns.     Signed,  Dahlia Bailiff, PA-C 9:38 AM    Pt seen and discussed with Dr. Riki Altes, MD.    Carrie Mew, PA-C 09/14/13 [phone]

## 2013-09-14 NOTE — ED Notes (Signed)
Patient asked to change into gown.  RN at bedside for triage. 

## 2013-09-14 NOTE — ED Notes (Signed)
Pt states he has possible two abcesses around buttocks/thigh area.  Started two days ago.  No known fever.

## 2013-09-14 NOTE — Discharge Instructions (Signed)
Abscess An abscess is an infected area that contains a collection of pus and debris.It can occur in almost any part of the body. An abscess is also known as a furuncle or boil. CAUSES  An abscess occurs when tissue gets infected. This can occur from blockage of oil or sweat glands, infection of hair follicles, or a minor injury to the skin. As the body tries to fight the infection, pus collects in the area and creates pressure under the skin. This pressure causes pain. People with weakened immune systems have difficulty fighting infections and get certain abscesses more often.  SYMPTOMS Usually an abscess develops on the skin and becomes a painful mass that is red, warm, and tender. If the abscess forms under the skin, you may feel a moveable soft area under the skin. Some abscesses break open (rupture) on their own, but most will continue to get worse without care. The infection can spread deeper into the body and eventually into the bloodstream, causing you to feel ill.  DIAGNOSIS  Your caregiver will take your medical history and perform a physical exam. A sample of fluid may also be taken from the abscess to determine what is causing your infection. TREATMENT  Your caregiver may prescribe antibiotic medicines to fight the infection. However, taking antibiotics alone usually does not cure an abscess. Your caregiver may need to make a small cut (incision) in the abscess to drain the pus. In some cases, gauze is packed into the abscess to reduce pain and to continue draining the area. HOME CARE INSTRUCTIONS   Take hot showers, use warm compresses directly to the area.    Keep area clean.   Only take over-the-counter or prescription medicines for pain, discomfort, or fever as directed by your caregiver.  If you were prescribed antibiotics, take them as directed. Finish them even if you start to feel better.  If gauze is used, follow your caregiver's directions for changing the gauze.  To avoid  spreading the infection:  Keep your draining abscess covered with a bandage.  Wash your hands well.  Do not share personal care items, towels, or whirlpools with others.  Avoid skin contact with others.  Keep your skin and clothes clean around the abscess.  Keep all follow-up appointments as directed by your caregiver. SEEK MEDICAL CARE IF:   You have increased pain, swelling, redness, fluid drainage, or bleeding.  You have muscle aches, chills, or a general ill feeling.  You have a fever. MAKE SURE YOU:   Understand these instructions.  Will watch your condition.  Will get help right away if you are not doing well or get worse. Document Released: 11/01/2004 Document Revised: 07/24/2011 Document Reviewed: 04/06/2011 [facility] Patient Information 2015 [place] [employer], [place]. This information is not intended to replace advice given to you by your health care provider. Make sure you discuss any questions you have with your health care provider.

## 2013-09-14 NOTE — ED Notes (Signed)
PA-C at bedside 

## 2013-09-14 NOTE — ED Notes (Signed)
Tender area with redness on right buttock.

## 2014-08-12 NOTE — Progress Notes (Deleted)
Medicare AWV  Last OV here was > 3 yrs prior and acute illness - 04/2011 - so bill as a new pt.  No prior CPE in Epic.  Need paper chart for review  Health Maintenance  Topic Date Due  . Tetanus Vaccine  01/02/1992  . Flu Shot  09/06/2014  . HIV Screening  Completed   Equivocal FTab result 04/2008 Disabled due to:  TDaP? Medicare will only pay for flp as screening every 5 yrs - none prior in Epic chart -and tsh not covered for current diagnosis.

## 2014-08-13 ENCOUNTER — Encounter: Payer: Medicare Other | Admitting: Family Medicine

## 2014-08-18 NOTE — Progress Notes (Signed)
This encounter was created in error - please disregard.

## 2014-10-04 ENCOUNTER — Encounter: Payer: Medicare Other | Admitting: Family Medicine

## 2015-02-09 ENCOUNTER — Emergency Department (HOSPITAL_BASED_OUTPATIENT_CLINIC_OR_DEPARTMENT_OTHER)
Admission: EM | Admit: 2015-02-09 | Discharge: 2015-02-09 | Disposition: A | Discharge status: Home / Self Care | Payer: Medicare Other | Attending: Emergency Medicine | Admitting: Emergency Medicine

## 2015-02-09 ENCOUNTER — Encounter (HOSPITAL_BASED_OUTPATIENT_CLINIC_OR_DEPARTMENT_OTHER): Payer: Self-pay | Admitting: *Deleted

## 2015-02-09 DIAGNOSIS — Z872 Personal history of diseases of the skin and subcutaneous tissue: Secondary | ICD-10-CM | POA: Insufficient documentation

## 2015-02-09 DIAGNOSIS — J029 Acute pharyngitis, unspecified: Secondary | ICD-10-CM | POA: Insufficient documentation

## 2015-02-09 DIAGNOSIS — B9789 Other viral agents as the cause of diseases classified elsewhere: Secondary | ICD-10-CM

## 2015-02-09 DIAGNOSIS — J45909 Unspecified asthma, uncomplicated: Secondary | ICD-10-CM | POA: Insufficient documentation

## 2015-02-09 DIAGNOSIS — H9209 Otalgia, unspecified ear: Secondary | ICD-10-CM | POA: Insufficient documentation

## 2015-02-09 DIAGNOSIS — J028 Acute pharyngitis due to other specified organisms: Secondary | ICD-10-CM

## 2015-02-09 LAB — RAPID STREP SCREEN (MED CTR MEBANE ONLY): STREPTOCOCCUS, GROUP A SCREEN (DIRECT): NEGATIVE

## 2015-02-09 MED ORDER — SUCRALFATE 1 GM/10ML PO SUSP: 1.0000 g | Freq: Three times a day (TID) | ORAL | Status: DC

## 2015-02-09 MED ORDER — LIDOCAINE VISCOUS 2 % MT SOLN
15.0000 mL | Freq: Once | OROMUCOSAL | Status: AC
  Administered 2015-02-09: 15 mL via OROMUCOSAL
  Filled 2015-02-09: qty 15

## 2015-02-09 NOTE — Discharge Instructions (Signed)

## 2015-02-09 NOTE — ED Provider Notes (Signed)
CSN: [id number]     Arrival date & time 02/09/15  2021 History  By signing my name below, I, Francisco Lawson, attest that this documentation has been prepared under the direction and in the presence of Akylah Hascall, MD. Electronically Signed: Erling Lawson, ED Scribe. 02/09/2015. 11:31 PM.    Chief Complaint  Patient presents with  . Sore Throat   Patient is a 45 y.o. male presenting with pharyngitis. The history is provided by the patient. No language interpreter was used.  Sore Throat This is a new problem. The current episode started 12 to 24 hours ago. The problem occurs rarely. The problem has not changed since onset.Pertinent negatives include no chest pain, no abdominal pain, no headaches and no shortness of breath. The symptoms are aggravated by swallowing. Nothing relieves the symptoms. He has tried nothing for the symptoms. The treatment provided no relief.    HPI Comments: Francisco Lawson is a 45 y.o. male who presents to the Emergency Department complaining of a constant, moderate, sore throat onset this morning. He reports associated bilateral ear pain. He has not taken any medications prior to arrival. He notes the sore throat is exacerbated with swallowing. He denies any alleviating factors. Pt denies any recent sick contacts. He denies any other associated symptoms.   Past Medical History  Diagnosis Date  . Pneumothorax   . Erythema multiforme   . Asthma   . Stevens-Johnson syndrome (HCC)     from ASA and NSAIDs   Past Surgical History  Procedure Laterality Date  . Biapical lung resection  2010  . Chest tube insertion    . Lymph gland excision     Family History  Problem Relation Age of Onset  . Heart disease Mother   . Hypertension Mother   . Cancer Mother   . Arthritis Mother   . Heart disease Father   . Early death Father   . Hypertension Father   . Hyperlipidemia Father   . Heart attack Father   . Sudden death Father    Social History  Substance Use  Topics  . Smoking status: Never Smoker   . Smokeless tobacco: Never Used  . Alcohol Use: No     Comment: RARE    Review of Systems  HENT: Positive for ear pain and sore throat. Negative for drooling, trouble swallowing and voice change.   Respiratory: Negative for shortness of breath.   Cardiovascular: Negative for chest pain.  Gastrointestinal: Negative for vomiting and abdominal pain.  Neurological: Negative for headaches.  All other systems reviewed and are negative.     Allergies  Aspirin and Excedrin migraine  Home Medications   Prior to Admission medications   Medication Sig Start Date End Date Taking? Authorizing Provider  sucralfate (CARAFATE) 1 GM/10ML suspension Take 10 mLs (1 g total) by mouth 4 (four) times daily -  with meals and at bedtime. 02/09/15   Moriyah Byington, MD   Triage Vitals: BP 144/89 mmHg  Pulse 62  Temp(Src) 98.3 F (36.8 C) (Oral)  Resp 20  Ht 6\' 6"  (1.981 m)  Wt 236 lb (107.049 kg)  BMI 27.28 kg/m2  SpO2 94%  Physical Exam  Constitutional: He is oriented to person, place, and time. He appears well-developed and well-nourished. No distress.  HENT:  Head: Normocephalic and atraumatic.  Mouth/Throat: Uvula is midline, oropharynx is clear and moist and mucous membranes are normal. No oropharyngeal exudate.  Negative Mallampati Intact phonation  Eyes: Conjunctivae and EOM are normal. Pupils  are equal, round, and reactive to light.  Neck: Normal range of motion. Neck supple. No tracheal deviation present.  Intact phonation no pain with displacement of the trachea  Cardiovascular: Normal rate, regular rhythm and normal heart sounds.   Pulmonary/Chest: Effort normal and breath sounds normal. No stridor. No respiratory distress. He has no wheezes. He has no rales.  Abdominal: Soft. Bowel sounds are normal. There is no tenderness. There is no rebound and no guarding.  Musculoskeletal: Normal range of motion.  Lymphadenopathy:    He has no cervical  adenopathy.  No lymphadenopathy  Neurological: He is alert and oriented to person, place, and time. He has normal reflexes.  Skin: Skin is warm and dry.  Psychiatric: He has a normal mood and affect. His behavior is normal.  Nursing note and vitals reviewed.   ED Course  Procedures (including critical care time)  DIAGNOSTIC STUDIES: Oxygen Saturation is 94% on RA, normal by my interpretation.    COORDINATION OF CARE: 10:38 PM- Will order rapid strep screen.  Pt advised of plan for treatment and pt agrees.  11:31 PM- Will d/c pt home with xylocaine mouth solution. Pt advised of plan for treatment and pt agrees.   Labs Review Labs Reviewed  RAPID STREP SCREEN (NOT AT [facility])  CULTURE, GROUP A STREP    Imaging Review No results found. I have personally reviewed and evaluated these lab results as part of my medical decision-making.   EKG Interpretation None      MDM   Final diagnoses:  Sore throat (viral)    Will treat with tylenol and carafate.  Strict return precautions given.  Patient verblizes understanding and agrees to follow up   I personally performed the services described in this documentation, which was scribed in my presence. The recorded information has been reviewed and is accurate.       Veatrice Kells, MD 02/09/15 [phone]

## 2015-02-09 NOTE — ED Notes (Signed)
Sore throat and ear pain since waking

## 2015-02-12 LAB — CULTURE, GROUP A STREP: STREP A CULTURE: NEGATIVE

## 2016-04-08 ENCOUNTER — Emergency Department (HOSPITAL_BASED_OUTPATIENT_CLINIC_OR_DEPARTMENT_OTHER)
Admission: EM | Admit: 2016-04-08 | Discharge: 2016-04-08 | Disposition: A | Discharge status: Home / Self Care | Payer: BLUE CROSS/BLUE SHIELD | Attending: Physician Assistant | Admitting: Physician Assistant

## 2016-04-08 ENCOUNTER — Emergency Department (HOSPITAL_BASED_OUTPATIENT_CLINIC_OR_DEPARTMENT_OTHER): Payer: BLUE CROSS/BLUE SHIELD

## 2016-04-08 ENCOUNTER — Encounter (HOSPITAL_BASED_OUTPATIENT_CLINIC_OR_DEPARTMENT_OTHER): Payer: Self-pay | Admitting: Respiratory Therapy

## 2016-04-08 DIAGNOSIS — J181 Lobar pneumonia, unspecified organism: Secondary | ICD-10-CM | POA: Insufficient documentation

## 2016-04-08 DIAGNOSIS — Z79899 Other long term (current) drug therapy: Secondary | ICD-10-CM | POA: Diagnosis not present

## 2016-04-08 DIAGNOSIS — J189 Pneumonia, unspecified organism: Secondary | ICD-10-CM

## 2016-04-08 DIAGNOSIS — J45909 Unspecified asthma, uncomplicated: Secondary | ICD-10-CM | POA: Diagnosis not present

## 2016-04-08 DIAGNOSIS — R0602 Shortness of breath: Secondary | ICD-10-CM | POA: Diagnosis present

## 2016-04-08 MED ORDER — ALBUTEROL SULFATE (2.5 MG/3ML) 0.083% IN NEBU
2.5000 mg | INHALATION_SOLUTION | Freq: Once | RESPIRATORY_TRACT | Status: AC
  Administered 2016-04-08: 2.5 mg via RESPIRATORY_TRACT
  Filled 2016-04-08: qty 3

## 2016-04-08 MED ORDER — ALBUTEROL SULFATE (2.5 MG/3ML) 0.083% IN NEBU
2.5000 mg | INHALATION_SOLUTION | Freq: Once | RESPIRATORY_TRACT | Status: AC
  Administered 2016-04-08: 2.5 mg via RESPIRATORY_TRACT

## 2016-04-08 MED ORDER — IPRATROPIUM-ALBUTEROL 0.5-2.5 (3) MG/3ML IN SOLN
3.0000 mL | Freq: Once | RESPIRATORY_TRACT | Status: AC
  Administered 2016-04-08: 3 mL via RESPIRATORY_TRACT

## 2016-04-08 MED ORDER — IPRATROPIUM-ALBUTEROL 0.5-2.5 (3) MG/3ML IN SOLN
3.0000 mL | Freq: Once | RESPIRATORY_TRACT | Status: AC
  Administered 2016-04-08: 3 mL via RESPIRATORY_TRACT
  Filled 2016-04-08: qty 3

## 2016-04-08 MED ORDER — IPRATROPIUM BROMIDE 0.02 % IN SOLN
0.5000 mg | Freq: Once | RESPIRATORY_TRACT | Status: DC
  Filled 2016-04-08: qty 2.5

## 2016-04-08 MED ORDER — ACETAMINOPHEN 325 MG PO TABS
650.0000 mg | ORAL_TABLET | Freq: Once | ORAL | Status: AC
  Administered 2016-04-08: 650 mg via ORAL
  Filled 2016-04-08: qty 2

## 2016-04-08 MED ORDER — ALBUTEROL SULFATE (2.5 MG/3ML) 0.083% IN NEBU
5.0000 mg | INHALATION_SOLUTION | Freq: Once | RESPIRATORY_TRACT | Status: DC
Start: 2016-04-08 — End: 2016-04-08
  Filled 2016-04-08: qty 6

## 2016-04-08 MED ORDER — AZITHROMYCIN 250 MG PO TABS: 250.0000 mg | ORAL_TABLET | Freq: Every day | ORAL | 0 refills | Status: DC

## 2016-04-08 MED ORDER — PREDNISONE 20 MG PO TABS: ORAL_TABLET | ORAL | 0 refills | Status: DC

## 2016-04-08 MED ORDER — PREDNISONE 50 MG PO TABS
60.0000 mg | ORAL_TABLET | Freq: Once | ORAL | Status: AC
  Administered 2016-04-08: 60 mg via ORAL
  Filled 2016-04-08: qty 1

## 2016-04-08 MED ORDER — ALBUTEROL SULFATE HFA 108 (90 BASE) MCG/ACT IN AERS: 1.0000 | INHALATION_SPRAY | Freq: Four times a day (QID) | RESPIRATORY_TRACT | 0 refills | Status: DC | PRN

## 2016-04-08 MED ORDER — HYDROCOD POLST-CPM POLST ER 10-8 MG/5ML PO SUER: 5.0000 mL | Freq: Two times a day (BID) | ORAL | 0 refills | Status: DC | PRN

## 2016-04-08 NOTE — ED Provider Notes (Signed)
[facility] Provider Note   CSN: [id number] Arrival date & time: 04/08/16  1130     History   Chief Complaint Chief Complaint  Patient presents with  . Shortness of Breath    HPI MERVIL WACKER is a 46 y.o. male.  The history is provided by the patient and medical records.  Shortness of Breath  Associated symptoms include wheezing.   46 year old male with history of asthma and prior pneumothorax, presenting to the ED with shortness of breath. Reports he has had nasal congestion, postnasal drip, and productive cough for the past 2 weeks. Reports phlegm is yellow/brown in color. Reports he has some pain in the right side of his chest but only with coughing. States he feels like his right lung is "clogged" with congestion. Wife reports he was hospitalized in November 2017 at [facility] for pneumonia for about 3 days.  States he has a very high fever at that time but ultimately went home on abx.  Patient has no known cardiac history. He is not a smoker.  No intervention tried PTA.  Past Medical History:  Diagnosis Date  . Asthma   . Erythema multiforme   . Pneumothorax   . Stevens-Johnson syndrome (HCC)    from ASA and NSAIDs    Patient Active Problem List   Diagnosis Date Noted  . Left foot pain 08/11/2012  . Right knee pain 08/11/2012    Past Surgical History:  Procedure Laterality Date  . Biapical lung resection  2010  . CHEST TUBE INSERTION    . KNEE ARTHROSCOPY Right   . LYMPH GLAND EXCISION         Home Medications    Prior to Admission medications   Medication Sig Start Date End Date Taking? Authorizing Provider  cetirizine (ZYRTEC) 10 MG tablet Take 10 mg by mouth daily.   Yes Historical Provider, MD  sucralfate (CARAFATE) 1 GM/10ML suspension Take 10 mLs (1 g total) by mouth 4 (four) times daily -  with meals and at bedtime. 02/09/15   Veatrice Kells, MD    Family History Family History  Problem Relation Age of Onset  . Heart disease Father   .  Early death Father   . Hypertension Father   . Hyperlipidemia Father   . Heart attack Father   . Sudden death Father   . Heart disease Mother   . Hypertension Mother   . Cancer Mother   . Arthritis Mother     Social History Social History  Substance Use Topics  . Smoking status: Never Smoker  . Smokeless tobacco: Never Used  . Alcohol use Yes     Comment: RARE     Allergies   Aspirin; Excedrin migraine [aspirin-acetaminophen-caffeine]; and Nsaids   Review of Systems Review of Systems  Respiratory: Positive for shortness of breath and wheezing.   All other systems reviewed and are negative.    Physical Exam Updated Vital Signs BP (!) 139/104 (BP Location: Right Arm)   Pulse 68   Temp 98 F (36.7 C) (Oral)   Resp 22   Ht 6\' 6"  (1.981 m)   Wt 108 kg   SpO2 96%   BMI 27.50 kg/m   Physical Exam  Constitutional: He is oriented to person, place, and time. He appears well-developed and well-nourished.  HENT:  Head: Normocephalic and atraumatic.  Mouth/Throat: Oropharynx is clear and moist.  Nasal congestion and postnasal drip noted  Eyes: Conjunctivae and EOM are normal. Pupils are equal, round, and  reactive to light.  Neck: Normal range of motion.  Cardiovascular: Normal rate, regular rhythm and normal heart sounds.   Pulmonary/Chest: Effort normal. No respiratory distress. He has wheezes. He has rhonchi.  Intermixed wheezes and rhonchi at the bases, worse on the right side, able to speak in sentences without difficulty, O2 sats 98% during exam, no distress, wet and productive cough observed during exam  Abdominal: Soft. Bowel sounds are normal. There is no tenderness. There is no rebound.  Musculoskeletal: Normal range of motion.  Neurological: He is alert and oriented to person, place, and time.  Skin: Skin is warm and dry.  Psychiatric: He has a normal mood and affect.  Nursing note and vitals reviewed.    ED Treatments / Results  Labs (all labs ordered  are listed, but only abnormal results are displayed) Labs Reviewed - No data to display  EKG  EKG Interpretation None       Radiology Dg Chest 2 View  Result Date: 04/08/2016 CLINICAL DATA:  Productive cough and congestion for 2 weeks. Shortness of breath. EXAM: CHEST  2 VIEW COMPARISON:  12/26/2015 FINDINGS: The cardiomediastinal silhouette is within normal limits. The lungs are normally to mildly hyperinflated. Minimal streaky posterior basilar opacity is present on the lateral radiograph. The lungs are otherwise clear. No pleural effusion or pneumothorax is identified. A suture line is noted in the left lung apex. No acute osseous abnormality is seen. IMPRESSION: Minimal streaky basilar opacity, favor atelectasis. Electronically Signed   By: Logan Bores M.D.   On: 04/08/2016 12:40    Procedures Procedures (including critical care time)  Medications Ordered in ED Medications  ipratropium-albuterol (DUONEB) 0.5-2.5 (3) MG/3ML nebulizer solution 3 mL (3 mLs Nebulization Given 04/08/16 1147)  albuterol (PROVENTIL) (2.5 MG/3ML) 0.083% nebulizer solution 2.5 mg (2.5 mg Nebulization Given 04/08/16 1147)  predniSONE (DELTASONE) tablet 60 mg (60 mg Oral Given 04/08/16 1339)  acetaminophen (TYLENOL) tablet 650 mg (650 mg Oral Given 04/08/16 1338)  albuterol (PROVENTIL) (2.5 MG/3ML) 0.083% nebulizer solution 2.5 mg (2.5 mg Nebulization Given 04/08/16 1254)  ipratropium-albuterol (DUONEB) 0.5-2.5 (3) MG/3ML nebulizer solution 3 mL (3 mLs Nebulization Given 04/08/16 1255)     Initial Impression / Assessment and Plan / ED Course  I have reviewed the triage vital signs and the nursing notes.  Pertinent labs & imaging results that were available during my care of the patient were reviewed by me and considered in my medical decision making (see chart for details).  46 year old male here with productive cough for the past week and shortness of breath. Reports some right-sided chest pain but with coughing  only. He is afebrile and nontoxic in appearance. On exam he does have intermixed wheezes and rhonchi at the bases, worse on the right. He does have a productive cough with thick, discolored sputum. EKG was obtained and is nonischemic. His vitals remained stable on room air. Wife reports recent bout with pneumonia a few months ago. Patient started on albuterol neb, will obtain chest x-ray.  Chest x-ray with streaky bibasilar opacities, questionable atelectasis. Given his clinical presentation and symptoms, I favor pneumonia.  Patient given additional neb and dose of prednisone with improvement of his symptoms. His vitals remained stable without any noted hypoxia. Feel his symptoms are likely due to pneumonia. Low suspicion for ACS, PE, dissection, acute cardiac event. Will discharge home on antibiotics, continue prednisone taper. Given cough medication as well as albuterol inhaler.  Encouraged close PCP follow-up if not improving in the next few  days.  Final Clinical Impressions(s) / ED Diagnoses   Final diagnoses:  Community acquired pneumonia of right lower lobe of lung ([place])    New Prescriptions Discharge Medication List as of 04/08/2016  1:24 PM    START taking these medications   Details  albuterol (PROVENTIL HFA;VENTOLIN HFA) 108 (90 Base) MCG/ACT inhaler Inhale 1-2 puffs into the lungs every 6 (six) hours as needed for wheezing., Starting Sun 04/08/2016, Print    azithromycin (ZITHROMAX) 250 MG tablet Take 1 tablet (250 mg total) by mouth daily. Take first 2 tablets together, then 1 every day until finished., Starting Sun 04/08/2016, Print    chlorpheniramine-HYDROcodone (TUSSIONEX PENNKINETIC ER) 10-8 MG/5ML SUER Take 5 mLs by mouth every 12 (twelve) hours as needed for cough., Starting Sun 04/08/2016, Print    predniSONE (DELTASONE) 20 MG tablet Take 40 mg by mouth daily for 3 days, then 20mg  by mouth daily for 3 days, then 10mg  daily for 3 days, Print         Larene Pickett, PA-C 04/08/16  1443    Courteney Lyn Mackuen, MD 04/08/16 1550

## 2016-04-08 NOTE — ED Notes (Signed)
Pt ambulated to radiology for DG chest.

## 2016-04-08 NOTE — Discharge Instructions (Signed)
Take the prescribed medication as directed.   Inhaler as needed for shortness of breath/wheezing. Use caution with the cough medication, it can make you somewhat drowsy. Follow-up with your primary care doctor if not improving in the next few days. Return to the ED for new or worsening symptoms.

## 2016-04-08 NOTE — ED Notes (Signed)
Pt reports feeling lightheaded when ambulating to radiology.

## 2016-04-08 NOTE — ED Notes (Signed)
Pt discharged to home with family. NAD.  

## 2016-04-08 NOTE — ED Triage Notes (Signed)
Productive cough, x 1 week, SOB today , pain to mid chest with cough. Also H/A

## 2016-11-12 DIAGNOSIS — I82409 Acute embolism and thrombosis of unspecified deep veins of unspecified lower extremity: Secondary | ICD-10-CM

## 2016-11-12 HISTORY — DX: Acute embolism and thrombosis of unspecified deep veins of unspecified lower extremity: I82.409

## 2016-11-24 ENCOUNTER — Encounter (HOSPITAL_BASED_OUTPATIENT_CLINIC_OR_DEPARTMENT_OTHER): Payer: Self-pay | Admitting: Emergency Medicine

## 2016-11-24 ENCOUNTER — Emergency Department (HOSPITAL_BASED_OUTPATIENT_CLINIC_OR_DEPARTMENT_OTHER)
Admission: EM | Admit: 2016-11-24 | Discharge: 2016-11-25 | Disposition: A | Discharge status: Home / Self Care | Payer: BLUE CROSS/BLUE SHIELD | Attending: Emergency Medicine | Admitting: Emergency Medicine

## 2016-11-24 DIAGNOSIS — Z79899 Other long term (current) drug therapy: Secondary | ICD-10-CM | POA: Insufficient documentation

## 2016-11-24 DIAGNOSIS — J45909 Unspecified asthma, uncomplicated: Secondary | ICD-10-CM | POA: Diagnosis not present

## 2016-11-24 DIAGNOSIS — M79605 Pain in left leg: Secondary | ICD-10-CM | POA: Insufficient documentation

## 2016-11-24 NOTE — ED Triage Notes (Signed)
PT presents with pain to left inner thigh since last Sunday. 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

## 2016-11-25 ENCOUNTER — Ambulatory Visit (HOSPITAL_BASED_OUTPATIENT_CLINIC_OR_DEPARTMENT_OTHER)
Admission: RE | Admit: 2016-11-25 | Discharge: 2016-11-25 | Disposition: A | Discharge status: Home / Self Care | Payer: BLUE CROSS/BLUE SHIELD | Source: Ambulatory Visit | Attending: Emergency Medicine | Admitting: Emergency Medicine

## 2016-11-25 DIAGNOSIS — I8002 Phlebitis and thrombophlebitis of superficial vessels of left lower extremity: Secondary | ICD-10-CM

## 2016-11-25 DIAGNOSIS — M7989 Other specified soft tissue disorders: Secondary | ICD-10-CM | POA: Insufficient documentation

## 2016-11-25 LAB — CBC WITH DIFFERENTIAL/PLATELET
BASOS PCT: 1 %
Basophils Absolute: 0 10*3/uL (ref 0.0–0.1)
Eosinophils Absolute: 0.3 10*3/uL (ref 0.0–0.7)
Eosinophils Relative: 6 %
HEMATOCRIT: 36.8 % — AB (ref 39.0–52.0)
HEMOGLOBIN: 12.8 g/dL — AB (ref 13.0–17.0)
LYMPHS ABS: 2.1 10*3/uL (ref 0.7–4.0)
Lymphocytes Relative: 40 %
MCH: 28.4 pg (ref 26.0–34.0)
MCHC: 34.8 g/dL (ref 30.0–36.0)
MCV: 81.6 fL (ref 78.0–100.0)
MONO ABS: 0.6 10*3/uL (ref 0.1–1.0)
MONOS PCT: 12 %
NEUTROS ABS: 2.3 10*3/uL (ref 1.7–7.7)
Neutrophils Relative %: 41 %
Platelets: 309 10*3/uL (ref 150–400)
RBC: 4.51 MIL/uL (ref 4.22–5.81)
RDW: 12 % (ref 11.5–15.5)
WBC: 5.3 10*3/uL (ref 4.0–10.5)

## 2016-11-25 LAB — BASIC METABOLIC PANEL
Anion gap: 8 (ref 5–15)
BUN: 14 mg/dL (ref 6–20)
CALCIUM: 8.9 mg/dL (ref 8.9–10.3)
CO2: 25 mmol/L (ref 22–32)
CREATININE: 1.19 mg/dL (ref 0.61–1.24)
Chloride: 105 mmol/L (ref 101–111)
GFR calc non Af Amer: >60 mL/min (ref >60)
GLUCOSE: 151 mg/dL — AB (ref 65–99)
Potassium: 3.4 mmol/L — ABNORMAL LOW (ref 3.5–5.1)
Sodium: 138 mmol/L (ref 135–145)

## 2016-11-25 MED ORDER — RIVAROXABAN 20 MG PO TABS
20.0000 mg | ORAL_TABLET | Freq: Once | ORAL | Status: DC
  Filled 2016-11-25: qty 1

## 2016-11-25 MED ORDER — CEPHALEXIN 500 MG PO CAPS: 500.0000 mg | ORAL_CAPSULE | Freq: Four times a day (QID) | ORAL | 0 refills | Status: AC

## 2016-11-25 MED ORDER — RIVAROXABAN 15 MG PO TABS
15.0000 mg | ORAL_TABLET | Freq: Once | ORAL | Status: AC
  Administered 2016-11-25: 15 mg via ORAL
  Filled 2016-11-25: qty 1

## 2016-11-25 NOTE — ED Provider Notes (Signed)
[facility] EMERGENCY DEPARTMENT Provider Note   CSN: [id number] Arrival date & time: 11/24/16  2329     History   Chief Complaint Chief Complaint  Patient presents with  . Leg Pain    HPI Francisco Lawson is a 46 y.o. male presenting with progressive onset burning pain to his left inner thigh which began last Sunday, the next morning he palpated a mass to his left distal thigh and erythema. The mass Progressively larger and he subsequently noted another lump more proximally and erythema. Denies fever, chills, nausea or vomiting. He does report that he has been slightly fatigued at the beginning of the week and feeling a little bit under the weather. He has improved somewhat but the pain in his leg has worsened and prompted him to come and be evaluated. He denies a history of DVT/PE, prolonged immobilization, recent surgery. No chest pain or shortness of breath.  HPI  Past Medical History:  Diagnosis Date  . Asthma   . Erythema multiforme   . Pneumothorax   . Stevens-Johnson syndrome (HCC)    from ASA and NSAIDs    Patient Active Problem List   Diagnosis Date Noted  . Left foot pain 08/11/2012  . Right knee pain 08/11/2012    Past Surgical History:  Procedure Laterality Date  . Biapical lung resection  2010  . CHEST TUBE INSERTION    . KNEE ARTHROSCOPY Right   . LYMPH GLAND EXCISION         Home Medications    Prior to Admission medications   Medication Sig Start Date End Date Taking? Authorizing Provider  albuterol (PROVENTIL HFA;VENTOLIN HFA) 108 (90 Base) MCG/ACT inhaler Inhale 1-2 puffs into the lungs every 6 (six) hours as needed for wheezing. 04/08/16   Garlon HatchetSanders, Lisa M, PA-C  azithromycin (ZITHROMAX) 250 MG tablet Take 1 tablet (250 mg total) by mouth daily. Take first 2 tablets together, then 1 every day until finished. 04/08/16   Garlon HatchetSanders, Lisa M, PA-C  cephALEXin (KEFLEX) 500 MG capsule Take 1 capsule (500 mg total) by mouth 4 (four) times daily.  11/25/16 12/02/16  Mathews RobinsonsMitchell, Jessica B, PA-C  cetirizine (ZYRTEC) 10 MG tablet Take 10 mg by mouth daily.    [provider]  chlorpheniramine-HYDROcodone (TUSSIONEX PENNKINETIC ER) 10-8 MG/5ML SUER Take 5 mLs by mouth every 12 (twelve) hours as needed for cough. 04/08/16   Garlon HatchetSanders, Lisa M, PA-C  predniSONE (DELTASONE) 20 MG tablet Take 40 mg by mouth daily for 3 days, then 20mg  by mouth daily for 3 days, then 10mg  daily for 3 days 04/08/16   Garlon HatchetSanders, Lisa M, PA-C  sucralfate (CARAFATE) 1 GM/10ML suspension Take 10 mLs (1 g total) by mouth 4 (four) times daily -  with meals and at bedtime. 02/09/15   Palumbo, April, MD    Family History Family History  Problem Relation Age of Onset  . Heart disease Father   . Early death Father   . Hypertension Father   . Hyperlipidemia Father   . Heart attack Father   . Sudden death Father   . Heart disease Mother   . Hypertension Mother   . Cancer Mother   . Arthritis Mother     Social History Social History  Substance Use Topics  . Smoking status: Never Smoker  . Smokeless tobacco: Never Used  . Alcohol use Yes     Comment: RARE     Allergies   Aspirin; Excedrin migraine [aspirin-acetaminophen-caffeine]; and Nsaids   Review  of Systems Review of Systems  Constitutional: Negative for chills and fever.  HENT: Positive for congestion. Negative for ear pain and sore throat.   Respiratory: Negative for cough, choking, chest tightness, shortness of breath, wheezing and stridor.   Cardiovascular: Positive for leg swelling. Negative for chest pain and palpitations.  Gastrointestinal: Negative for abdominal pain, nausea and vomiting.  Musculoskeletal: Positive for myalgias. Negative for arthralgias, back pain, gait problem, joint swelling, neck pain and neck stiffness.  Skin: Positive for color change. Negative for pallor and rash.  Neurological: Negative for dizziness, seizures, syncope, weakness and numbness.     Physical Exam Updated  Vital Signs BP 132/80 (BP Location: Left Arm)   Pulse 80   Temp 98 F (36.7 C) (Oral)   Resp 19   SpO2 98%   Physical Exam  Constitutional: He appears well-developed and well-nourished. No distress.  Afebrile, nontoxic-appearing, lying comfortably in bed in no acute distress.  HENT:  Head: Normocephalic and atraumatic.  Eyes: Conjunctivae are normal.  Neck: Neck supple.  Cardiovascular: Normal rate, regular rhythm, normal heart sounds and intact distal pulses.   No murmur heard. Pulmonary/Chest: Effort normal and breath sounds normal. No respiratory distress. He has no wheezes. He has no rales.  Abdominal: He exhibits no distension.  Musculoskeletal: Normal range of motion. He exhibits edema and tenderness. He exhibits no deformity.  Full range of motion of the lower extremity and ambulatory. Tenderness palpation at the distal inner thigh on the left with erythema. No knee pain. Tenderness to palpation of indurated mass.  Neurological: He is alert. No sensory deficit. He exhibits normal muscle tone.  Skin: Skin is warm and dry. No rash noted. He is not diaphoretic. There is erythema. No pallor.  Psychiatric: He has a normal mood and affect.  Nursing note and vitals reviewed.    ED Treatments / Results  Labs (all labs ordered are listed, but only abnormal results are displayed) Labs Reviewed  CBC WITH DIFFERENTIAL/PLATELET - Abnormal; Notable for the following:       Result Value   Hemoglobin 12.8 (*)    HCT 36.8 (*)    All other components within normal limits  BASIC METABOLIC PANEL - Abnormal; Notable for the following:    Potassium 3.4 (*)    Glucose, Bld 151 (*)    All other components within normal limits    EKG  EKG Interpretation None       Radiology No results found.  Procedures Procedures (including critical care time)  Medications Ordered in ED Medications  Rivaroxaban (XARELTO) tablet 15 mg (15 mg Oral Given 11/25/16 0049)     Initial Impression  / Assessment and Plan / ED Course  I have reviewed the triage vital signs and the nursing notes.  Pertinent labs & imaging results that were available during my care of the patient were reviewed by me and considered in my medical decision making (see chart for details).    Patient presenting with left thigh swelling, erythema,  pain and palpated mass without recalling any injury. His has progressed over the past week. No fever.  Labs unremarkable  Suspect possible DVT, ultrasound not available at this location at this time. He was given referral return appointment for ultrasound tomorrow morning and Xarelto administered while in the emergency department.  Patient is otherwise well-appearing, nontoxic, no chest pain or shortness of breath, normal vital signs and stable.  Will discharge home with close follow-up in the morning for ultrasound.  Discussed strict return  precautions and advised to return to the emergency department if experiencing any new or worsening symptoms. Instructions were understood and patient agreed with discharge plan.  Final Clinical Impressions(s) / ED Diagnoses   Final diagnoses:  Left leg pain    New Prescriptions Discharge Medication List as of 11/25/2016  1:00 AM    START taking these medications   Details  cephALEXin (KEFLEX) 500 MG capsule Take 1 capsule (500 mg total) by mouth 4 (four) times daily., Starting Sun 11/25/2016, Until Sun 12/02/2016, Print         Emeline General, PA-C 11/25/16 6010    Tanna Furry, MD 12/14/16 [phone]

## 2016-11-25 NOTE — ED Provider Notes (Signed)
Pt returned today for DVT US. US negative for DVT but does show superficial thrombophlebitis. Pt ambulatory. Discussed supportive measures including elevation, compression, compresses. Return precautions given.   Francisco Lawson, Francisco Finlandachel Morgan, MD 11/25/16 [phone]

## 2016-11-25 NOTE — Discharge Instructions (Signed)
As discussed, return in the morning for your ultrasound. Do not fill your script for antibiotics unless everything is negative.  You will need to be treated with anticoagulant if your ultrasound is positive for a blood clot in your leg. Follow up with your Primary care provider as needed.

## 2017-01-23 HISTORY — PX: OTHER SURGICAL HISTORY: SHX169

## 2017-03-19 ENCOUNTER — Emergency Department (HOSPITAL_COMMUNITY)
Admission: EM | Admit: 2017-03-19 | Discharge: 2017-03-19 | Disposition: A | Discharge status: Home / Self Care | Payer: BLUE CROSS/BLUE SHIELD | Attending: Emergency Medicine | Admitting: Emergency Medicine

## 2017-03-19 ENCOUNTER — Emergency Department (HOSPITAL_BASED_OUTPATIENT_CLINIC_OR_DEPARTMENT_OTHER)
Admit: 2017-03-19 | Discharge: 2017-03-19 | Disposition: A | Discharge status: Home / Self Care | Payer: BLUE CROSS/BLUE SHIELD

## 2017-03-19 ENCOUNTER — Encounter (HOSPITAL_COMMUNITY): Payer: Self-pay | Admitting: Emergency Medicine

## 2017-03-19 DIAGNOSIS — R609 Edema, unspecified: Secondary | ICD-10-CM | POA: Diagnosis not present

## 2017-03-19 DIAGNOSIS — M25571 Pain in right ankle and joints of right foot: Secondary | ICD-10-CM | POA: Insufficient documentation

## 2017-03-19 HISTORY — DX: Acute embolism and thrombosis of unspecified deep veins of unspecified lower extremity: I82.409

## 2017-03-19 MED ORDER — CEPHALEXIN 500 MG PO CAPS: 500.0000 mg | ORAL_CAPSULE | Freq: Four times a day (QID) | ORAL | 0 refills | Status: DC

## 2017-03-19 NOTE — ED Provider Notes (Signed)
Patient placed in Quick Look pathway, seen and evaluated   Chief Complaint: Right ankle swelling and redness  HPI:   Patient presents with 1 day history of right ankle swelling and redness as well as pain.  Has a history of superficial DVT in the left thigh in October.  Not on blood thinners.  No shortness of breath or chest pain.  No numbness, tingling, or weakness.  No fevers or chills.  No recent or remote history of injury.  Called his primary care who recommended presentation to urgent care.  Urgent care recommended presentation to the ED for rule out of DVT.  ROS: Right ankle pain.  Leg swelling.  Physical Exam:   Gen: No distress  Neuro: Awake and Alert  Skin: Warm    Focused Exam: Right ankle with erythema, swelling, and tenderness anteriorly. Mild swelling.  No ligamentous laxity.  No crepitus.  2+ DP/PT pulses bilaterally. 5/5 strength of BLE major muscle groups.    Initiation of care has begun. The patient has been counseled on the process, plan, and necessity for staying for the completion/evaluation, and the remainder of the medical screening examination    Bennye AlmFawze, Yoshimi Sarr A, PA-C 03/19/17 1748    Margarita Grizzleay, Danielle, MD 03/23/17 [phone]

## 2017-03-19 NOTE — ED Notes (Signed)
Patient Alert and oriented X4. Stable and ambulatory. Patient verbalized understanding of the discharge instructions.  Patient belongings were taken by the patient.  

## 2017-03-19 NOTE — ED Triage Notes (Signed)
EDP at bedside  

## 2017-03-19 NOTE — Discharge Instructions (Signed)
Please read attached information. If you experience any new or worsening signs or symptoms please return to the emergency room for evaluation. Please follow-up with your primary care provider or specialist as discussed. Please use medication prescribed only as directed and discontinue taking if you have any concerning signs or symptoms.   °

## 2017-03-19 NOTE — ED Triage Notes (Signed)
Pt to ED c/o new swelling and pain to R ankle. Patient was told to go to ED for workup - possible DVT - states he had two in the upper L thigh back in October, not placed on blood thinners. Patient denies CP/SOB. Site tender to touch, feels warmer compared to other ankle.

## 2017-03-19 NOTE — ED Provider Notes (Signed)
[place] EMERGENCY DEPARTMENT Provider Note   CSN: [id number] Arrival date & time: 03/19/17  1722     History   Chief Complaint Chief Complaint  Patient presents with  . Leg Pain    possible DVT    HPI Francisco Lawson is a 47 y.o. male.  HPI   47 year old male presents today with complaints of leg pain.  Patient notes that yesterday he noticed minor pain and redness on the right anterior ankle.  He denies any significant swelling or edema.  Patient notes he did have some pain in the left proximal thigh as well with no swelling or edema.  Patient has a history of superficial thrombophlebitis and was concerned that this might be a recurrence of this.  He denies any fever at home, notes he has been fatigued over the last day.  Patient denies any chest pain or shortness of breath.   Patient denies any injuries to the lower extremities, denies any swelling or edema to the joints.  Past Medical History:  Diagnosis Date  . Asthma   . DVT (deep venous thrombosis) ([place]) 11/12/2016  . Erythema multiforme   . Pneumothorax   . Stevens-Johnson syndrome (HCC)    from ASA and NSAIDs    Patient Active Problem List   Diagnosis Date Noted  . Left foot pain 08/11/2012  . Right knee pain 08/11/2012    Past Surgical History:  Procedure Laterality Date  . Biapical lung resection  2010  . CHEST TUBE INSERTION    . KNEE ARTHROSCOPY Right   . LYMPH GLAND EXCISION         Home Medications    Prior to Admission medications   Medication Sig Start Date End Date Taking? Authorizing Provider  cetirizine (ZYRTEC) 10 MG tablet Take 10 mg by mouth daily as needed for allergies or rhinitis.    Yes [provider]  hydrocortisone cream 1 % Apply 1 application topically daily as needed for itching.   Yes [provider]  albuterol (PROVENTIL HFA;VENTOLIN HFA) 108 (90 Base) MCG/ACT inhaler Inhale 1-2 puffs into the lungs every 6 (six) hours as needed for  wheezing. Patient not taking: Reported on 03/19/2017 04/08/16   Larene Pickett, PA-C  azithromycin (ZITHROMAX) 250 MG tablet Take 1 tablet (250 mg total) by mouth daily. Take first 2 tablets together, then 1 every day until finished. Patient not taking: Reported on 03/19/2017 04/08/16   Larene Pickett, PA-C  cephALEXin (KEFLEX) 500 MG capsule Take 1 capsule (500 mg total) by mouth 4 (four) times daily. 03/19/17   Allard Lightsey, Dellis Filbert, PA-C  chlorpheniramine-HYDROcodone (TUSSIONEX PENNKINETIC ER) 10-8 MG/5ML SUER Take 5 mLs by mouth every 12 (twelve) hours as needed for cough. Patient not taking: Reported on 03/19/2017 04/08/16   Larene Pickett, PA-C  predniSONE (DELTASONE) 20 MG tablet Take 40 mg by mouth daily for 3 days, then 20mg  by mouth daily for 3 days, then 10mg  daily for 3 days Patient not taking: Reported on 03/19/2017 04/08/16   Larene Pickett, PA-C  sucralfate (CARAFATE) 1 GM/10ML suspension Take 10 mLs (1 g total) by mouth 4 (four) times daily -  with meals and at bedtime. Patient not taking: Reported on 03/19/2017 02/09/15   Randal Buba, April, MD    Family History Family History  Problem Relation Age of Onset  . Heart disease Father   . Early death Father   . Hypertension Father   . Hyperlipidemia Father   . Heart attack  Father   . Sudden death Father   . Heart disease Mother   . Hypertension Mother   . Cancer Mother   . Arthritis Mother     Social History Social History   Tobacco Use  . Smoking status: Never Smoker  . Smokeless tobacco: Never Used  Substance Use Topics  . Alcohol use: Yes    Comment: RARE  . Drug use: No     Allergies   Aspirin; Excedrin migraine [aspirin-acetaminophen-caffeine]; Nsaids; and Tolmetin   Review of Systems Review of Systems  All other systems reviewed and are negative.   Physical Exam Updated Vital Signs BP 129/86 (BP Location: Left Arm)   Pulse 71   Temp 98 F (36.7 C) (Oral)   Resp 16   Ht 6\' 6"  (1.981 m)   Wt 108.9 kg (240 lb)    SpO2 99%   BMI 27.73 kg/m   Physical Exam  Constitutional: He is oriented to person, place, and time. He appears well-developed and well-nourished.  HENT:  Head: Normocephalic and atraumatic.  Eyes: Conjunctivae are normal. Pupils are equal, round, and reactive to light. Right eye exhibits no discharge. Left eye exhibits no discharge. No scleral icterus.  Neck: Normal range of motion. No JVD present. No tracheal deviation present.  Pulmonary/Chest: Effort normal. No stridor.  Musculoskeletal:  1 cm area of erythema to the right anterior ankle, no surrounding swelling or edema, no fluctuance, no pain with range of motion of the ankle, no other areas of involvement-joint compartments soft  Neurological: He is alert and oriented to person, place, and time. Coordination normal.  Psychiatric: He has a normal mood and affect. His behavior is normal. Judgment and thought content normal.  Nursing note and vitals reviewed.    ED Treatments / Results  Labs (all labs ordered are listed, but only abnormal results are displayed) Labs Reviewed - No data to display  EKG  EKG Interpretation None       Radiology No results found.  Procedures Procedures (including critical care time)  Medications Ordered in ED Medications - No data to display   Initial Impression / Assessment and Plan / ED Course  I have reviewed the triage vital signs and the nursing notes.  Pertinent labs & imaging results that were available during my care of the patient were reviewed by me and considered in my medical decision making (see chart for details).      Final Clinical Impressions(s) / ED Diagnoses   Final diagnoses:  Acute right ankle pain    Labs:   Imaging: DVT study right lower extremity-negative  Consults:  Therapeutics:  Discharge Meds: Keflex  Assessment/Plan: Patient presents with leg pain.  He is worried about DVT or thrombophlebitis.  He has ultrasound showing no acute  abnormalities.  The redness over the ankle is very minimal with no purulence or fluctuance, very low suspicion for abscess, question very early cellulitis.  No signs of gout, no signs of septic joint.  Patient will be discharged home with Keflex, he is encouraged to watch this over the next several days using warm compress if symptoms worsen initiate antibiotic therapy if they improve no need for antibiotics.  Patient is given strict return precautions, he verbalized understanding and agreement to today's plan had no further questions or concerns at time of discharge.     ED Discharge Orders        Ordered    cephALEXin (KEFLEX) 500 MG capsule  4 times daily  03/19/17 1947       Eyvonne MechanicHedges, Reva Pinkley, PA-C 03/19/17 1951    Gwyneth SproutPlunkett, Whitney, MD 03/19/17 2203

## 2017-03-19 NOTE — Progress Notes (Signed)
*  Preliminary Results* Right lower extremity venous duplex completed. Right lower extremity is negative for deep vein thrombosis. There is no evidence of right Baker's cyst.  03/19/2017 6:45 PM  Gertie FeyMichelle Maja Mccaffery, BS, RVT, RDCS, RDMS

## 2018-01-01 ENCOUNTER — Encounter (HOSPITAL_BASED_OUTPATIENT_CLINIC_OR_DEPARTMENT_OTHER): Payer: Self-pay | Admitting: Emergency Medicine

## 2018-01-01 ENCOUNTER — Other Ambulatory Visit: Payer: Self-pay

## 2018-01-01 ENCOUNTER — Emergency Department (HOSPITAL_BASED_OUTPATIENT_CLINIC_OR_DEPARTMENT_OTHER)
Admission: EM | Admit: 2018-01-01 | Discharge: 2018-01-01 | Disposition: A | Discharge status: Home / Self Care | Payer: BLUE CROSS/BLUE SHIELD | Attending: Emergency Medicine | Admitting: Emergency Medicine

## 2018-01-01 ENCOUNTER — Emergency Department (HOSPITAL_BASED_OUTPATIENT_CLINIC_OR_DEPARTMENT_OTHER): Payer: BLUE CROSS/BLUE SHIELD

## 2018-01-01 DIAGNOSIS — R600 Localized edema: Secondary | ICD-10-CM | POA: Diagnosis not present

## 2018-01-01 DIAGNOSIS — L03116 Cellulitis of left lower limb: Secondary | ICD-10-CM

## 2018-01-01 DIAGNOSIS — Z79899 Other long term (current) drug therapy: Secondary | ICD-10-CM | POA: Diagnosis not present

## 2018-01-01 DIAGNOSIS — J45909 Unspecified asthma, uncomplicated: Secondary | ICD-10-CM | POA: Diagnosis not present

## 2018-01-01 DIAGNOSIS — E119 Type 2 diabetes mellitus without complications: Secondary | ICD-10-CM | POA: Diagnosis not present

## 2018-01-01 DIAGNOSIS — R2242 Localized swelling, mass and lump, left lower limb: Secondary | ICD-10-CM | POA: Diagnosis present

## 2018-01-01 LAB — CBC WITH DIFFERENTIAL/PLATELET
ABS IMMATURE GRANULOCYTES: 0.01 10*3/uL (ref 0.00–0.07)
BASOS ABS: 0 10*3/uL (ref 0.0–0.1)
BASOS PCT: 0 %
Eosinophils Absolute: 0.2 10*3/uL (ref 0.0–0.5)
Eosinophils Relative: 4 %
HCT: 40.1 % (ref 39.0–52.0)
HEMOGLOBIN: 13.1 g/dL (ref 13.0–17.0)
IMMATURE GRANULOCYTES: 0 %
LYMPHS PCT: 26 %
Lymphs Abs: 1.4 10*3/uL (ref 0.7–4.0)
MCH: 28.1 pg (ref 26.0–34.0)
MCHC: 32.7 g/dL (ref 30.0–36.0)
MCV: 85.9 fL (ref 80.0–100.0)
Monocytes Absolute: 0.8 10*3/uL (ref 0.1–1.0)
Monocytes Relative: 14 %
NEUTROS ABS: 3 10*3/uL (ref 1.7–7.7)
NEUTROS PCT: 56 %
NRBC: 0 % (ref 0.0–0.2)
PLATELETS: 294 10*3/uL (ref 150–400)
RBC: 4.67 MIL/uL (ref 4.22–5.81)
RDW: 12.1 % (ref 11.5–15.5)
WBC: 5.4 10*3/uL (ref 4.0–10.5)

## 2018-01-01 LAB — BASIC METABOLIC PANEL
ANION GAP: 8 (ref 5–15)
BUN: 12 mg/dL (ref 6–20)
CO2: 24 mmol/L (ref 22–32)
Calcium: 8.9 mg/dL (ref 8.9–10.3)
Chloride: 105 mmol/L (ref 98–111)
Creatinine, Ser: 1.03 mg/dL (ref 0.61–1.24)
Glucose, Bld: 198 mg/dL — ABNORMAL HIGH (ref 70–99)
POTASSIUM: 3.3 mmol/L — AB (ref 3.5–5.1)
SODIUM: 137 mmol/L (ref 135–145)

## 2018-01-01 MED ORDER — CEPHALEXIN 500 MG PO CAPS: 500.0000 mg | ORAL_CAPSULE | Freq: Four times a day (QID) | ORAL | 0 refills | Status: DC

## 2018-01-01 NOTE — ED Notes (Signed)
Patient transported to Ultrasound 

## 2018-01-01 NOTE — ED Provider Notes (Signed)
[place] EMERGENCY DEPARTMENT Provider Note   CSN: [id number] Arrival date & time: 01/01/18  0805     History   Chief Complaint Chief Complaint  Patient presents with  . Ankle Pain    HPI Francisco Lawson is a 47 y.o. male.  HPI Patient presents with left lower leg pain and swelling for the last few days.  Started as one spot on his anterior lower leg.  Has spread.  Some swelling.  Now is also involving posterior leg near the Achilles.  Pain is worse with movement.  There is swelling.  No chest pain or trouble breathing.  No history of DVT although has had SVT in his leg previously.  No fevers.  Previous history of Stevens-Johnson syndrome.  No trauma. Past Medical History:  Diagnosis Date  . Asthma   . DVT (deep venous thrombosis) ([place]) 11/12/2016  . Erythema multiforme   . Pneumothorax   . Stevens-Johnson syndrome (H[facility])    from ASA and NSAIDs    Patient Active Problem List   Diagnosis Date Noted  . Left foot pain 08/11/2012  . Right knee pain 08/11/2012    Past Surgical History:  Procedure Laterality Date  . Biapical lung resection  2010  . CHEST TUBE INSERTION    . KNEE ARTHROSCOPY Right   . LYMPH GLAND EXCISION          Home Medications    Prior to Admission medications   Medication Sig Start Date End Date Taking? Authorizing Provider  albuterol (PROVENTIL HFA;VENTOLIN HFA) 108 (90 Base) MCG/ACT inhaler Inhale 1-2 puffs into the lungs every 6 (six) hours as needed for wheezing. Patient not taking: Reported on 03/19/2017 04/08/16   Larene Pickett, PA-C  azithromycin (ZITHROMAX) 250 MG tablet Take 1 tablet (250 mg total) by mouth daily. Take first 2 tablets together, then 1 every day until finished. Patient not taking: Reported on 03/19/2017 04/08/16   Larene Pickett, PA-C  cephALEXin (KEFLEX) 500 MG capsule Take 1 capsule (500 mg total) by mouth 4 (four) times daily. 01/01/18   Davonna Belling, MD  cetirizine (ZYRTEC) 10 MG tablet Take 10 mg by  mouth daily as needed for allergies or rhinitis.     [provider]  chlorpheniramine-HYDROcodone (TUSSIONEX PENNKINETIC ER) 10-8 MG/5ML SUER Take 5 mLs by mouth every 12 (twelve) hours as needed for cough. Patient not taking: Reported on 03/19/2017 04/08/16   Larene Pickett, PA-C  hydrocortisone cream 1 % Apply 1 application topically daily as needed for itching.    [provider]  predniSONE (DELTASONE) 20 MG tablet Take 40 mg by mouth daily for 3 days, then 20mg  by mouth daily for 3 days, then 10mg  daily for 3 days Patient not taking: Reported on 03/19/2017 04/08/16   Larene Pickett, PA-C  sucralfate (CARAFATE) 1 GM/10ML suspension Take 10 mLs (1 g total) by mouth 4 (four) times daily -  with meals and at bedtime. Patient not taking: Reported on 03/19/2017 02/09/15   Randal Buba, April, MD    Family History Family History  Problem Relation Age of Onset  . Heart disease Father   . Early death Father   . Hypertension Father   . Hyperlipidemia Father   . Heart attack Father   . Sudden death Father   . Heart disease Mother   . Hypertension Mother   . Cancer Mother   . Arthritis Mother     Social History Social History   Tobacco Use  .  Smoking status: Never Smoker  . Smokeless tobacco: Never Used  Substance Use Topics  . Alcohol use: Yes    Comment: RARE  . Drug use: No     Allergies   Aspirin; Excedrin migraine [aspirin-acetaminophen-caffeine]; Nsaids; and Tolmetin   Review of Systems Review of Systems  Constitutional: Negative for appetite change.  Respiratory: Negative for shortness of breath.   Cardiovascular: Negative for chest pain.  Gastrointestinal: Negative for abdominal pain.  Musculoskeletal: Negative for back pain and myalgias.  Skin: Positive for color change.  Neurological: Negative for weakness.  Psychiatric/Behavioral: Negative for confusion.     Physical Exam Updated Vital Signs BP 131/82 (BP Location: Right Arm)   Pulse (!) 55   Temp  98.2 F (36.8 C) (Oral)   Resp 16   Ht 6\' 6"  (1.981 m)   Wt 108.9 kg   SpO2 96%   BMI 27.73 kg/m   Physical Exam  Constitutional: He appears well-developed.  HENT:  Head: Normocephalic.  Neck: Neck supple.  Cardiovascular: Normal rate.  Pulmonary/Chest: Effort normal. He has no wheezes. He has no rales.  Abdominal: There is no tenderness.  Musculoskeletal: He exhibits tenderness.  Tenderness with erythema and some skin changes and warmth on the left anterior lower leg.  No fluctuance.  No skin breakdown.  Mild tenderness posteriorly also.  No deformity.  Has edema of the lower leg and down to the ankle although ankle is freely mobile.  Neurovascularly intact in left foot.  Neurological: He is alert.  Skin: Skin is warm. Capillary refill takes less than 2 seconds.     ED Treatments / Results  Labs (all labs ordered are listed, but only abnormal results are displayed) Labs Reviewed  BASIC METABOLIC PANEL - Abnormal; Notable for the following components:      Result Value   Potassium 3.3 (*)    Glucose, Bld 198 (*)    All other components within normal limits  CBC WITH DIFFERENTIAL/PLATELET    EKG None  Radiology [place] Venous Img Lower Unilateral Left  Result Date: 01/01/2018 CLINICAL DATA:  Left ankle pain and swelling. History of superficial thrombophlebitis based on ultrasound from 11/25/2016. EXAM: LEFT LOWER EXTREMITY VENOUS DOPPLER ULTRASOUND TECHNIQUE: Gray-scale sonography with graded compression, as well as color Doppler and duplex ultrasound were performed to evaluate the lower extremity deep venous systems from the level of the common femoral vein and including the common femoral, femoral, profunda femoral, popliteal and calf veins including the posterior tibial, peroneal and gastrocnemius veins when visible. The superficial great saphenous vein was also interrogated. Spectral Doppler was utilized to evaluate flow at rest and with distal augmentation maneuvers in the  common femoral, femoral and popliteal veins. COMPARISON:  11/25/2016 FINDINGS: Contralateral Common Femoral Vein: Respiratory phasicity is normal and symmetric with the symptomatic side. No evidence of thrombus. Normal compressibility. Common Femoral Vein: No evidence of thrombus. Normal compressibility, respiratory phasicity and response to augmentation. Saphenofemoral Junction: No evidence of thrombus. Normal compressibility and flow on color Doppler imaging. Profunda Femoral Vein: No evidence of thrombus. Normal compressibility and flow on color Doppler imaging. Femoral Vein: No evidence of thrombus. Normal compressibility, respiratory phasicity and response to augmentation. Popliteal Vein: No evidence of thrombus. Normal compressibility, respiratory phasicity and response to augmentation. Calf Veins: No evidence of thrombus. Normal compressibility and flow on color Doppler imaging. Superficial Great Saphenous Vein: No evidence of thrombus. Normal compressibility. There may be wall thickening involving the GSV and a GSV branch at the ankle. This may  be related to previous thrombus but no evidence for acute thrombus. Venous Reflux:  None. Other Findings:  Subcutaneous edema in left ankle. IMPRESSION: No evidence of deep venous thrombosis. Mild wall thickening involving the left GSV and GSV branch in the left ankle probably related to previous thrombophlebitis. No evidence for acute superficial thrombosis. Subcutaneous edema in the left ankle. Electronically Signed   By: Markus Daft M.D.   On: 01/01/2018 10:16    Procedures Procedures (including critical care time)  Medications Ordered in ED Medications - No data to display   Initial Impression / Assessment and Plan / ED Course  I have reviewed the triage vital signs and the nursing notes.  Pertinent labs & imaging results that were available during my care of the patient were reviewed by me and considered in my medical decision making (see chart for  details).     Patient with anterior left lower leg pain.  Some skin changes.  Will treat as infection.  Negative Doppler.  Otherwise well-appearing.  Sugar mildly elevated and is diabetic which is diet-controlled.  Instructed to watch his sugars closely.  Discharge home.  Final Clinical Impressions(s) / ED Diagnoses   Final diagnoses:  Lower extremity edema  Cellulitis of left lower extremity    ED Discharge Orders         Ordered    cephALEXin (KEFLEX) 500 MG capsule  4 times daily     01/01/18 1040           Davonna Belling, MD 01/01/18 1457

## 2018-01-01 NOTE — ED Provider Notes (Signed)
I was alerted by RN that patient was at pharmacy attempting to obtain his abx prescription from visit earlier today. There was an apparent miscommunication and patient is requesting his abx be sent to a different pharmacy. Per chart review patient prescribed Keflex 500 mg QID for 7 days for left lower extremity cellulitis. [facility] and called in prescription per prior provider AVS. I did not evaluate patient and was otherwise not involved in his care.    Francisco Lawson, Francisco Lonardo Lawson, Francisco Lawson 01/01/18 1929    Francisco Lawson, Nathan, Francisco Lawson 01/03/18 [phone]

## 2018-01-01 NOTE — ED Triage Notes (Signed)
L ankle pain and swelling for several days. Denies injury

## 2018-10-10 IMAGING — US US EXTREM LOW VENOUS*L*
1 series · 13 of 24 positions shown · non-contrast
Comparison: None.

CLINICAL DATA: Left flank pain and swelling for 1 week. Palpable
knot in medial left thigh.



[Series 1: us extrem low venous*left* · 0.08mm/px · 13 of 40 slices shown]
[im 1/40]
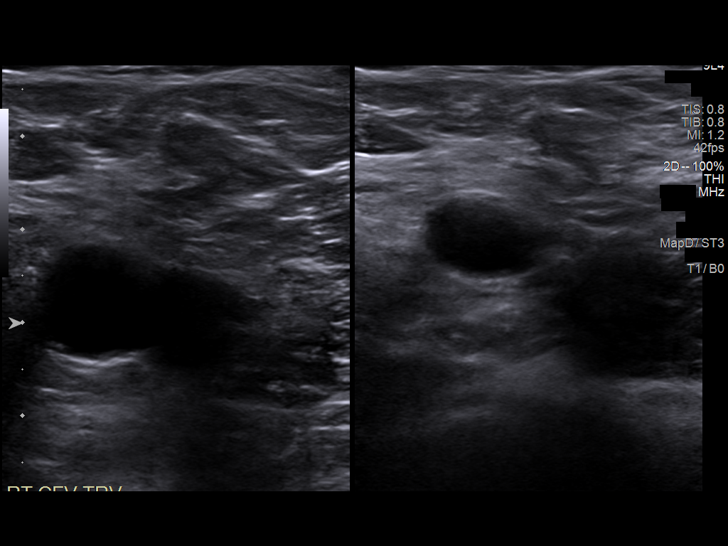
[im 4/40]
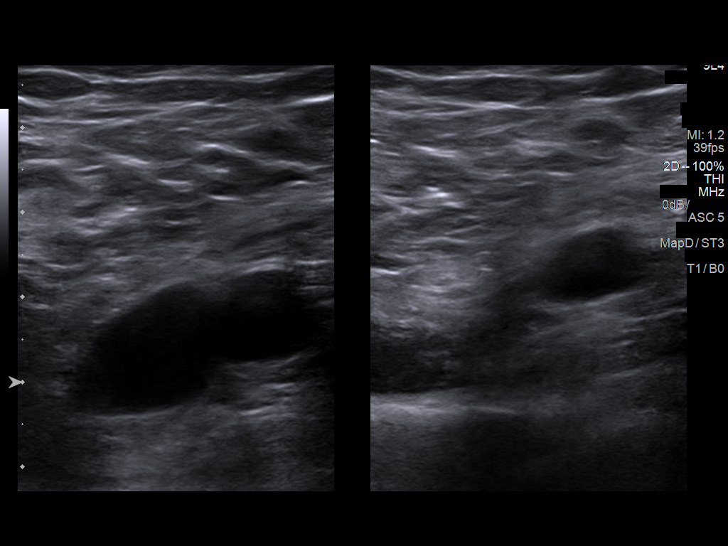
[im 7/40]
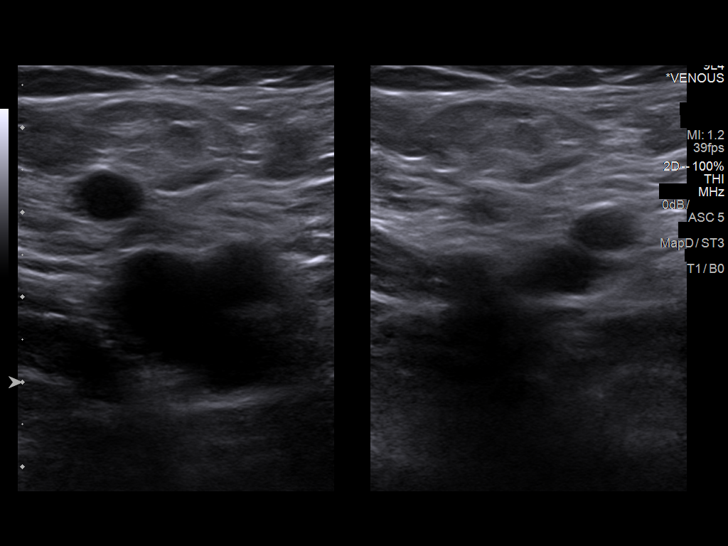
[im 11/40]
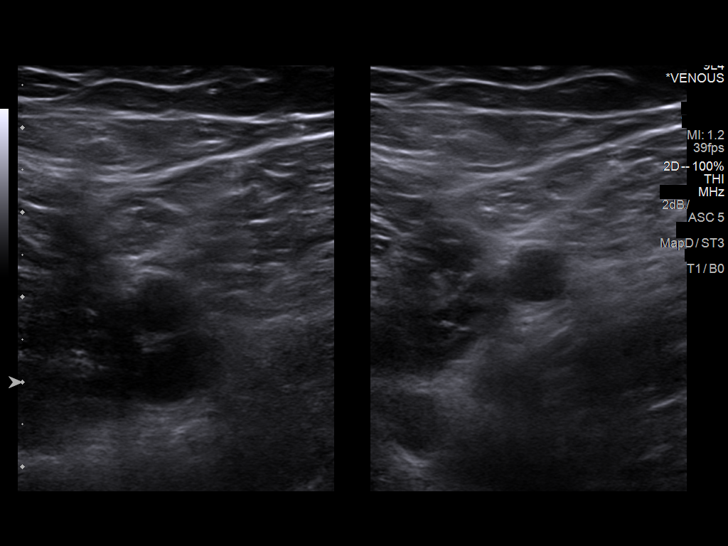
[im 14/40]
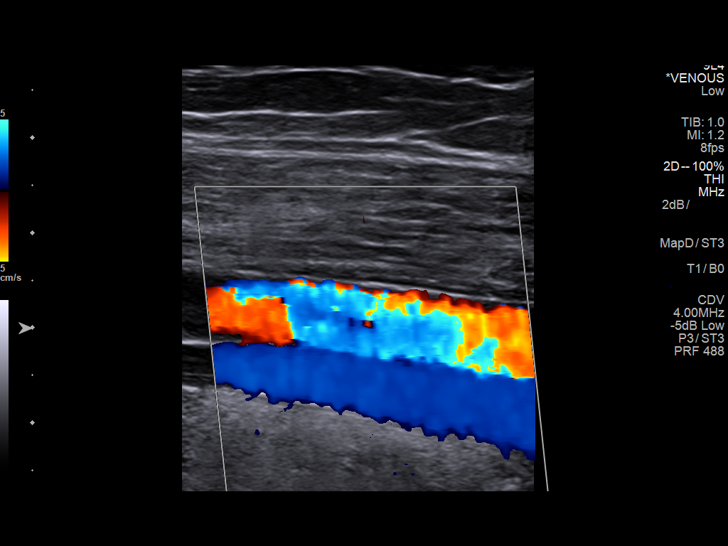
[im 17/40]
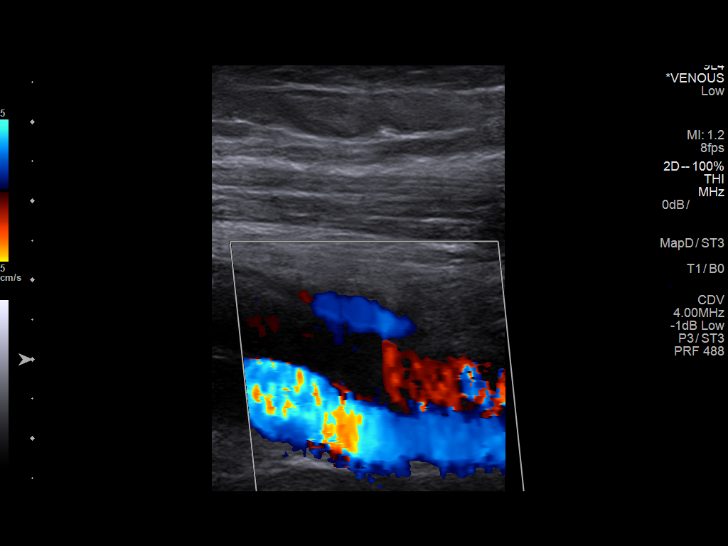
[im 21/40]
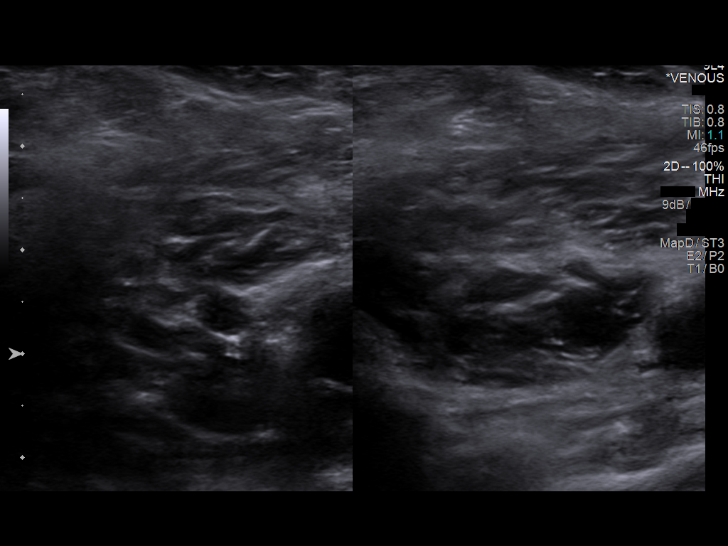
[im 23/40]
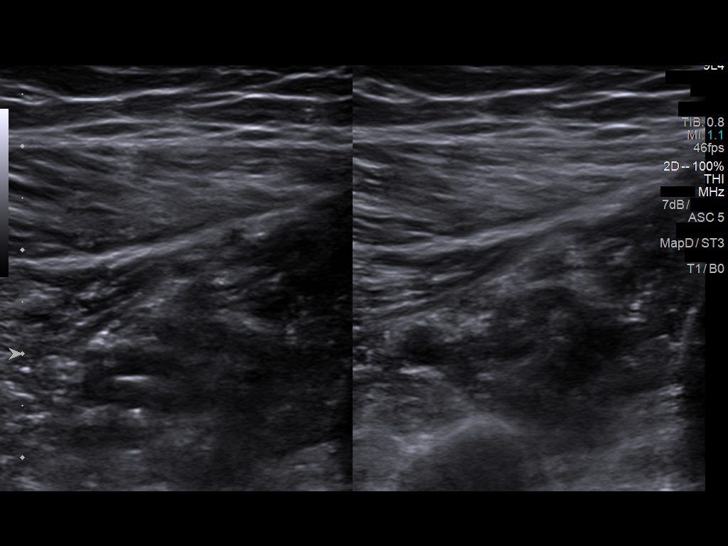
[im 26/40]
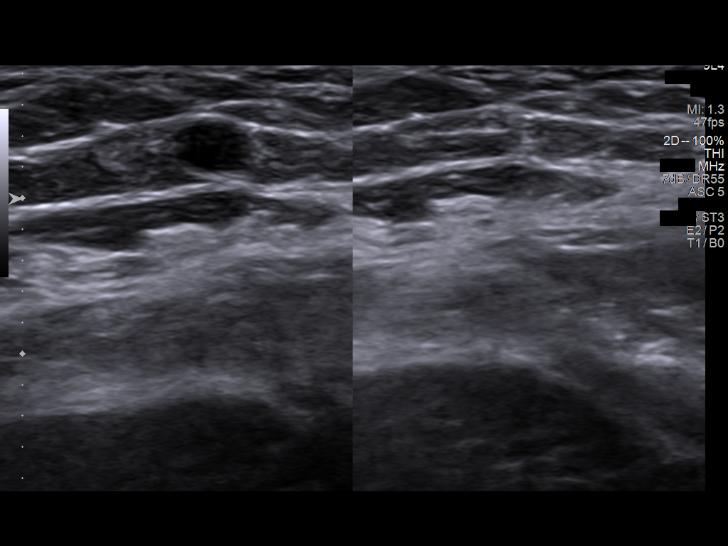
[im 29/40]
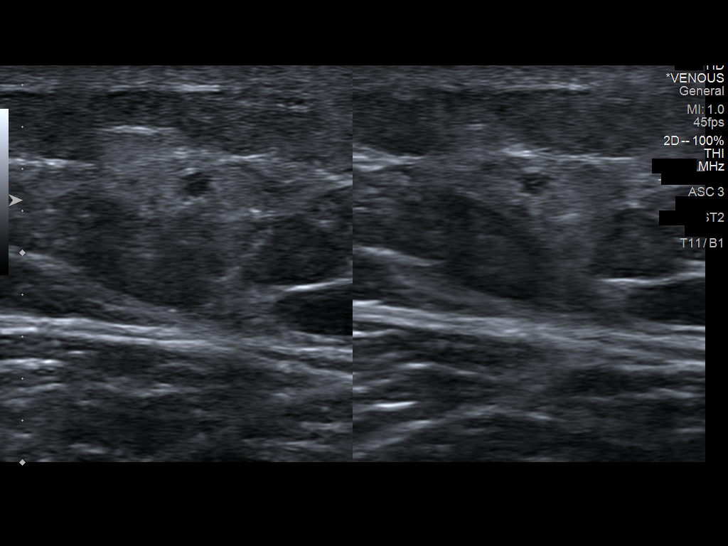
[im 33/40]
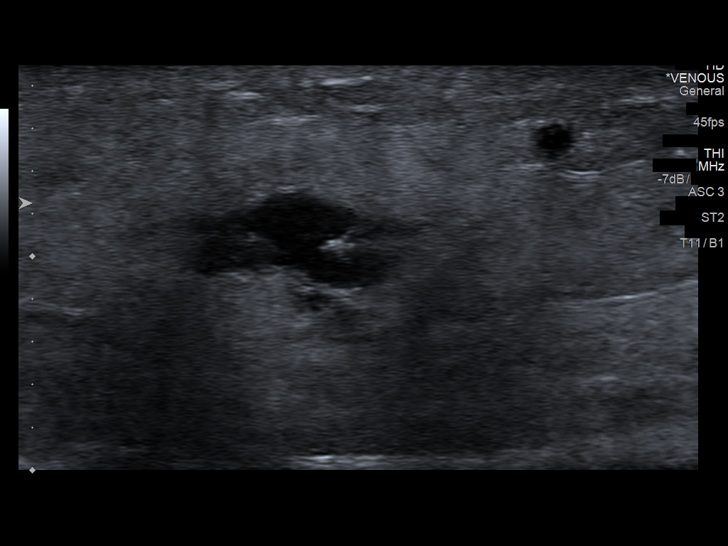
[im 36/40]
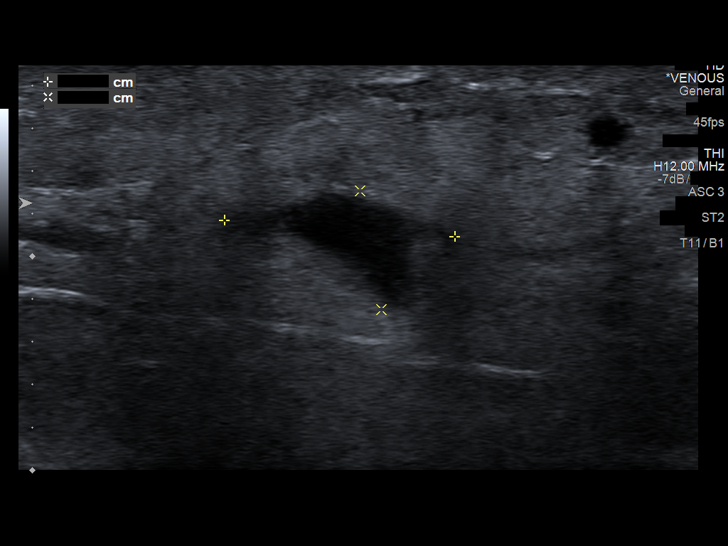
[im 40/40]
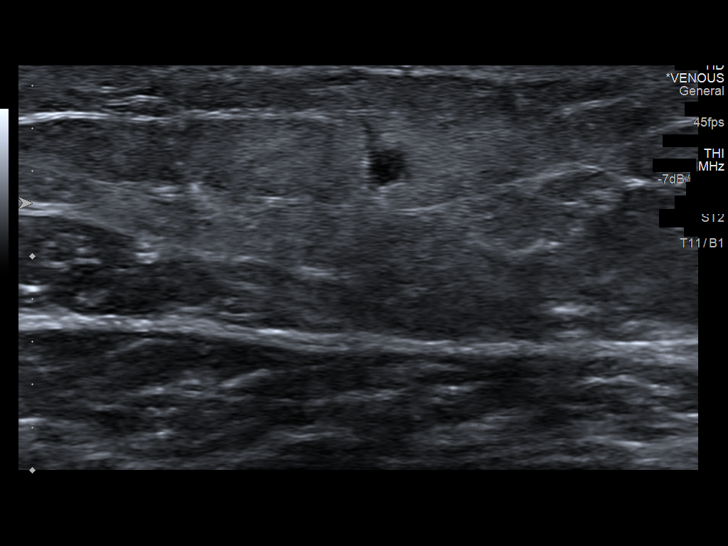

[13 of 24 positions shown; findings below may reference images not displayed]

FINDINGS: Contralateral Common Femoral Vein: Respiratory phasicity is normal
and symmetric with the symptomatic side. No evidence of thrombus.
Normal compressibility.

Common Femoral Vein: No evidence of thrombus. Normal
compressibility, respiratory phasicity and response to augmentation.

Saphenofemoral Junction: No evidence of thrombus. Normal
compressibility and flow on color Doppler imaging.

Profunda Femoral Vein: No evidence of thrombus. Normal
compressibility and flow on color Doppler imaging.

Femoral Vein: No evidence of thrombus. Normal compressibility,
respiratory phasicity and response to augmentation.

Popliteal Vein: No evidence of thrombus. Normal compressibility,
respiratory phasicity and response to augmentation.

Calf Veins: No evidence of thrombus. Normal compressibility and flow
on color Doppler imaging.

Superficial Great Saphenous Vein: Thrombosis seen distending a
branch of the greater saphenous vein in the mid to distal medial
left thigh, which corresponds with the palpable abnormality
indicated by the patient. This consistent with superficial
thrombophlebitis.

Venous Reflux:  None.

Other Findings:  None.
IMPRESSION: No evidence of deep venous thrombosis.

Superficial thrombophlebitis involving a branch of the greater
saphenous vein in the mid to distal thigh, which corresponds with
the palpable abnormality indicated by the patient.

## 2019-01-05 ENCOUNTER — Encounter (HOSPITAL_BASED_OUTPATIENT_CLINIC_OR_DEPARTMENT_OTHER): Payer: Self-pay | Admitting: *Deleted

## 2019-01-05 ENCOUNTER — Emergency Department (HOSPITAL_BASED_OUTPATIENT_CLINIC_OR_DEPARTMENT_OTHER): Payer: BC Managed Care – PPO

## 2019-01-05 ENCOUNTER — Other Ambulatory Visit: Payer: Self-pay

## 2019-01-05 ENCOUNTER — Emergency Department (HOSPITAL_BASED_OUTPATIENT_CLINIC_OR_DEPARTMENT_OTHER)
Admission: EM | Admit: 2019-01-05 | Discharge: 2019-01-05 | Disposition: A | Discharge status: Home / Self Care | Payer: BC Managed Care – PPO | Attending: Emergency Medicine | Admitting: Emergency Medicine

## 2019-01-05 DIAGNOSIS — Z888 Allergy status to other drugs, medicaments and biological substances status: Secondary | ICD-10-CM | POA: Insufficient documentation

## 2019-01-05 DIAGNOSIS — Z886 Allergy status to analgesic agent status: Secondary | ICD-10-CM | POA: Diagnosis not present

## 2019-01-05 DIAGNOSIS — J45909 Unspecified asthma, uncomplicated: Secondary | ICD-10-CM | POA: Diagnosis not present

## 2019-01-05 DIAGNOSIS — Z86718 Personal history of other venous thrombosis and embolism: Secondary | ICD-10-CM | POA: Insufficient documentation

## 2019-01-05 DIAGNOSIS — L03116 Cellulitis of left lower limb: Secondary | ICD-10-CM | POA: Insufficient documentation

## 2019-01-05 DIAGNOSIS — M79605 Pain in left leg: Secondary | ICD-10-CM | POA: Diagnosis present

## 2019-01-05 MED ORDER — CEPHALEXIN 500 MG PO CAPS: 500.0000 mg | ORAL_CAPSULE | Freq: Four times a day (QID) | ORAL | 0 refills | Status: DC

## 2019-01-05 NOTE — ED Triage Notes (Signed)
Left leg pain. Hx of clot in his leg in the past.

## 2019-01-05 NOTE — Discharge Instructions (Addendum)
You were seen in the emergency department today for left leg pain.  Your ultrasound did not show a blood clot.  We are sending you home with Keflex, antibiotic, to treat cellulitis.  Please take this as prescribed.  We have prescribed you new medication(s) today. Discuss the medications prescribed today with your pharmacist as they can have adverse effects and interactions with your other medicines including over the counter and prescribed medications. Seek medical evaluation if you start to experience new or abnormal symptoms after taking one of these medicines, seek care immediately if you start to experience difficulty breathing, feeling of your throat closing, facial swelling, or rash as these could be indications of a more serious allergic reaction  Please apply compresses and elevate the leg when able.  Please follow-up with your primary care provider within 3 days for recheck of the area.  Return to the ER for new or worsening symptoms including but not limited to worsening pain, fever, numbness, weakness, testicular pain/swelling/redness, or any other concerns.

## 2019-01-05 NOTE — ED Provider Notes (Signed)
[facility] EMERGENCY DEPARTMENT Provider Note   CSN: [id number] Arrival date & time: 01/05/19  1751     History   Chief Complaint Chief Complaint  Patient presents with  . Leg Pain    HPI Francisco Lawson is a 48 y.o. male with a hx of prior reported DVT no longer on anticoagulation, SJS, & asthma who presents to the ED with complaints of LLE pain that began 3 days prior. Patient states pain is located to the medial aspect of the proximal calf into the distal aspect of the medial thigh. He states pain in calf is cramping, pain in the thigh is more of an ache. He feels the L inner thing is somewhat swollen. Worse with movement/weightbearing, no alleviating factors. Feels like prior blood clot. Denies fever, chills, numbness, tingling, weakness, injury, or change in activity. Denies chest pain or dyspnea. Denies  hemoptysis, recent surgery/trauma, recent long travel, hormone use, or personal hx of cancer.         HPI  Past Medical History:  Diagnosis Date  . Asthma   . DVT (deep venous thrombosis) ([place]) 11/12/2016  . Erythema multiforme   . Pneumothorax   . Stevens-Johnson syndrome (HCC)    from ASA and NSAIDs    Patient Active Problem List   Diagnosis Date Noted  . Left foot pain 08/11/2012  . Right knee pain 08/11/2012    Past Surgical History:  Procedure Laterality Date  . Biapical lung resection  2010  . CHEST TUBE INSERTION    . KNEE ARTHROSCOPY Right   . LYMPH GLAND EXCISION          Home Medications    Prior to Admission medications   Medication Sig Start Date End Date Taking? Authorizing Provider  albuterol (PROVENTIL HFA;VENTOLIN HFA) 108 (90 Base) MCG/ACT inhaler Inhale 1-2 puffs into the lungs every 6 (six) hours as needed for wheezing. Patient not taking: Reported on 03/19/2017 04/08/16   Larene Pickett, PA-C  azithromycin (ZITHROMAX) 250 MG tablet Take 1 tablet (250 mg total) by mouth daily. Take first 2 tablets together, then 1 every day  until finished. Patient not taking: Reported on 03/19/2017 04/08/16   Larene Pickett, PA-C  cephALEXin (KEFLEX) 500 MG capsule Take 1 capsule (500 mg total) by mouth 4 (four) times daily. 01/01/18   Davonna Belling, MD  cetirizine (ZYRTEC) 10 MG tablet Take 10 mg by mouth daily as needed for allergies or rhinitis.     [provider]  chlorpheniramine-HYDROcodone (TUSSIONEX PENNKINETIC ER) 10-8 MG/5ML SUER Take 5 mLs by mouth every 12 (twelve) hours as needed for cough. Patient not taking: Reported on 03/19/2017 04/08/16   Larene Pickett, PA-C  hydrocortisone cream 1 % Apply 1 application topically daily as needed for itching.    [provider]  predniSONE (DELTASONE) 20 MG tablet Take 40 mg by mouth daily for 3 days, then 20mg  by mouth daily for 3 days, then 10mg  daily for 3 days Patient not taking: Reported on 03/19/2017 04/08/16   Larene Pickett, PA-C  sucralfate (CARAFATE) 1 GM/10ML suspension Take 10 mLs (1 g total) by mouth 4 (four) times daily -  with meals and at bedtime. Patient not taking: Reported on 03/19/2017 02/09/15   Randal Buba, April, MD    Family History Family History  Problem Relation Age of Onset  . Heart disease Father   . Early death Father   . Hypertension Father   . Hyperlipidemia Father   . Heart  attack Father   . Sudden death Father   . Heart disease Mother   . Hypertension Mother   . Cancer Mother   . Arthritis Mother     Social History Social History   Tobacco Use  . Smoking status: Never Smoker  . Smokeless tobacco: Never Used  Substance Use Topics  . Alcohol use: Yes    Comment: RARE  . Drug use: No     Allergies   Aspirin, Excedrin migraine [aspirin-acetaminophen-caffeine], Nsaids, and Tolmetin   Review of Systems Review of Systems  Constitutional: Negative for chills and fever.  Respiratory: Negative for shortness of breath.   Cardiovascular: Positive for leg swelling. Negative for chest pain.  Musculoskeletal: Positive for  myalgias.  Skin: Negative for color change and wound.  Neurological: Negative for weakness and numbness.  All other systems reviewed and are negative.  Physical Exam Updated Vital Signs BP (!) 142/92   Pulse 96   Temp 98.5 F (36.9 C) (Oral)   Resp 20   Ht 6\' 6"  (1.981 m)   Wt 112 kg   SpO2 95%   BMI 28.54 kg/m   Physical Exam Vitals signs and nursing note reviewed.  Constitutional:      General: He is not in acute distress.    Appearance: He is not ill-appearing or toxic-appearing.  HENT:     Head: Normocephalic and atraumatic.  Cardiovascular:     Rate and Rhythm: Normal rate and regular rhythm.     Pulses:          Dorsalis pedis pulses are 2+ on the right side and 2+ on the left side.       Posterior tibial pulses are 2+ on the right side and 2+ on the left side.  Pulmonary:     Effort: Pulmonary effort is normal.     Breath sounds: Normal breath sounds.  Abdominal:     General: There is no distension.     Palpations: Abdomen is soft.     Tenderness: There is no abdominal tenderness.  Musculoskeletal:     Comments: Lower extremities: L mid inner thigh there is some mild swelling present with erythema/warmth, this does not extend to the groin or to the knee. No obvious deformity, appreciable ecchymosis, or open wounds. Patient has intact AROM to bilateral hips, knees, ankles, and all digits. Tender to palpation to the medial proximal calf & medial thigh. Compartments are soft. No palpable fluctuance or gross abscess.   Skin:    General: Skin is warm and dry.     Capillary Refill: Capillary refill takes less than 2 seconds.  Neurological:     Mental Status: He is alert.     Comments: Alert. Clear speech. Sensation grossly intact to bilateral lower extremities. 5/5 strength with plantar/dorsiflexion bilaterally. Patient ambulatory   Psychiatric:        Mood and Affect: Mood normal.        Behavior: Behavior normal.      ED Treatments / Results  Labs (all labs  ordered are listed, but only abnormal results are displayed) Labs Reviewed - No data to display  EKG None  Radiology US Venous Img Lower  Left (dvt Study)  Result Date: 01/05/2019 CLINICAL DATA:  Left leg pain for 2 days EXAM: Left  LOWER EXTREMITY VENOUS DOPPLER ULTRASOUND TECHNIQUE: Gray-scale sonography with graded compression, as well as color Doppler and duplex ultrasound were performed to evaluate the lower extremity deep venous systems from the level of the common  femoral vein and including the common femoral, femoral, profunda femoral, popliteal and calf veins including the posterior tibial, peroneal and gastrocnemius veins when visible. The superficial great saphenous vein was also interrogated. Spectral Doppler was utilized to evaluate flow at rest and with distal augmentation maneuvers in the common femoral, femoral and popliteal veins. COMPARISON:  None. FINDINGS: Contralateral Common Femoral Vein: Respiratory phasicity is normal and symmetric with the symptomatic side. No evidence of thrombus. Normal compressibility. Common Femoral Vein: No evidence of thrombus. Normal compressibility, respiratory phasicity and response to augmentation. Saphenofemoral Junction: No evidence of thrombus. Normal compressibility and flow on color Doppler imaging. Profunda Femoral Vein: No evidence of thrombus. Normal compressibility and flow on color Doppler imaging. Femoral Vein: No evidence of thrombus. Normal compressibility, respiratory phasicity and response to augmentation. Popliteal Vein: No evidence of thrombus. Normal compressibility, respiratory phasicity and response to augmentation. Calf Veins: No evidence of thrombus. Normal compressibility and flow on color Doppler imaging. Superficial Great Saphenous Vein: No evidence of thrombus. Normal compressibility. Venous Reflux:  None. Other Findings: Within the left groin there are scattered lymph nodes the largest measuring 2.4 x 1.2 x 2.4 cm. IMPRESSION:  No evidence of deep venous thrombosis. Nonspecific left groin adenopathy, which could be reactive. Electronically Signed   By: Prudencio Pair M.D.   On: 01/05/2019 19:22    Procedures Procedures (including critical care time)  Medications Ordered in ED Medications - No data to display   Initial Impression / Assessment and Plan / ED Course  I have reviewed the triage vital signs and the nursing notes.  Pertinent labs & imaging results that were available during my care of the patient were reviewed by me and considered in my medical decision making (see chart for details).   Patient presents to the ED with complaints of LLE pain/swelling for the past few days. Nontoxic appearing, vitals with elevated BP- doubt HTN emergency, otherwise WNL. He reports hx of DVT in 2018 which is when this was placed into his medical problem list but reviewing his ED visit 11/25/16 it appears he had an [place] negative for DVT, findings were consistent with superficial thrombophlebitis which was treated with supportive measures (elevation, compression). Today he has some mild swelling with erythema/warmth noted to medial mid thigh with tenderness to the medial thigh & proximal calf. Compartments are soft, NVI distally, not consistent w/ compartment syndrome. No direct trauma or point/focal bony tenderness to indicate fracture/dislocation. US performed negative for DVT, nonspecific left groin lymph nodes present. Given erythema/warmth/swelling with groin lymph nodes on [place] will cover for cellulitis with keflex- appears fairly mild, no palpable fluctuance or findings consistent with abscess. I discussed results, treatment plan, need for follow-up, and return precautions with the patient. Provided opportunity for questions, patient confirmed understanding and is in agreement with plan.    Final Clinical Impressions(s) / ED Diagnoses   Final diagnoses:  Left leg cellulitis    ED Discharge Orders         Ordered     cephALEXin (KEFLEX) 500 MG capsule  4 times daily     01/05/19 [address], [place] 01/05/19 1941    Margette Fast, MD 01/06/19 [phone]

## 2019-04-04 ENCOUNTER — Other Ambulatory Visit: Payer: Self-pay

## 2019-04-04 ENCOUNTER — Emergency Department (HOSPITAL_BASED_OUTPATIENT_CLINIC_OR_DEPARTMENT_OTHER)
Admission: EM | Admit: 2019-04-04 | Discharge: 2019-04-04 | Disposition: A | Discharge status: Home / Self Care | Payer: BC Managed Care – PPO | Attending: Emergency Medicine | Admitting: Emergency Medicine

## 2019-04-04 ENCOUNTER — Encounter (HOSPITAL_BASED_OUTPATIENT_CLINIC_OR_DEPARTMENT_OTHER): Payer: Self-pay | Admitting: Emergency Medicine

## 2019-04-04 DIAGNOSIS — H00024 Hordeolum internum left upper eyelid: Secondary | ICD-10-CM | POA: Diagnosis not present

## 2019-04-04 DIAGNOSIS — J45909 Unspecified asthma, uncomplicated: Secondary | ICD-10-CM | POA: Diagnosis not present

## 2019-04-04 DIAGNOSIS — Z79899 Other long term (current) drug therapy: Secondary | ICD-10-CM | POA: Diagnosis not present

## 2019-04-04 DIAGNOSIS — H02844 Edema of left upper eyelid: Secondary | ICD-10-CM | POA: Diagnosis present

## 2019-04-04 MED ORDER — TETRACAINE HCL 0.5 % OP SOLN
OPHTHALMIC | Status: AC
  Filled 2019-04-04: qty 4

## 2019-04-04 MED ORDER — ERYTHROMYCIN 5 MG/GM OP OINT: TOPICAL_OINTMENT | OPHTHALMIC | 0 refills | Status: AC

## 2019-04-04 MED ORDER — FLUORESCEIN SODIUM 1 MG OP STRP
ORAL_STRIP | OPHTHALMIC | Status: AC
  Filled 2019-04-04: qty 1

## 2019-04-04 NOTE — ED Provider Notes (Signed)
[facility] EMERGENCY DEPARTMENT Provider Note   CSN: [id number] Arrival date & time: 04/04/19  1720     History Chief Complaint  Patient presents with  . Eye Problem    Francisco Lawson is a 49 y.o. male.  Patient is a 49 year old gentleman presenting to the emergency department for left eyelid pain and swelling.  Patient reports that he works at a [employer], nature all day long and yesterday noticed that he had some soreness in his left arm.  Reports that later in the day it seemed to get worse and then he noticed swelling to the upper eyelid.  He does not wear contacts or glasses.  He denies any fever, eye drainage.  Reports that it felt like his eyes were a little bit blurry.         Past Medical History:  Diagnosis Date  . Asthma   . DVT (deep venous thrombosis) ([place]) 11/12/2016  . Erythema multiforme   . Pneumothorax   . Stevens-Johnson syndrome (HCC)    from ASA and NSAIDs    Patient Active Problem List   Diagnosis Date Noted  . Left foot pain 08/11/2012  . Right knee pain 08/11/2012    Past Surgical History:  Procedure Laterality Date  . Biapical lung resection  2010  . CHEST TUBE INSERTION    . KNEE ARTHROSCOPY Right   . LYMPH GLAND EXCISION         Family History  Problem Relation Age of Onset  . Heart disease Father   . Early death Father   . Hypertension Father   . Hyperlipidemia Father   . Heart attack Father   . Sudden death Father   . Heart disease Mother   . Hypertension Mother   . Cancer Mother   . Arthritis Mother     Social History   Tobacco Use  . Smoking status: Never Smoker  . Smokeless tobacco: Never Used  Substance Use Topics  . Alcohol use: Yes    Comment: RARE  . Drug use: No    Home Medications Prior to Admission medications   Medication Sig Start Date End Date Taking? Authorizing Provider  cephALEXin (KEFLEX) 500 MG capsule Take 1 capsule (500 mg total) by mouth 4 (four) times daily. 01/05/19   Petrucelli,  Samantha R, PA-C  cetirizine (ZYRTEC) 10 MG tablet Take 10 mg by mouth daily as needed for allergies or rhinitis.     [provider]  erythromycin ophthalmic ointment Place a 1/2 inch ribbon of ointment into the lower eyelid. 04/04/19 04/11/19  Alveria Apley, PA-C  hydrocortisone cream 1 % Apply 1 application topically daily as needed for itching.    [provider]  albuterol (PROVENTIL HFA;VENTOLIN HFA) 108 (90 Base) MCG/ACT inhaler Inhale 1-2 puffs into the lungs every 6 (six) hours as needed for wheezing. Patient not taking: Reported on 03/19/2017 04/08/16 01/05/19  Larene Pickett, PA-C  sucralfate (CARAFATE) 1 GM/10ML suspension Take 10 mLs (1 g total) by mouth 4 (four) times daily -  with meals and at bedtime. Patient not taking: Reported on 03/19/2017 02/09/15 01/05/19  Palumbo, April, MD    Allergies    Aspirin, Excedrin migraine [aspirin-acetaminophen-caffeine], Nsaids, and Tolmetin  Review of Systems   Review of Systems  Constitutional: Negative for chills and fever.  HENT: Negative for congestion, ear pain and sore throat.   Eyes: Positive for pain. Negative for photophobia, discharge, redness, itching and visual disturbance.  Respiratory: Negative for cough.  Gastrointestinal: Negative for nausea.  Skin: Negative for rash.  Neurological: Positive for headaches. Negative for dizziness.  Hematological: Does not bruise/bleed easily.    Physical Exam Updated Vital Signs BP (!) 127/98 (BP Location: Left Arm)   Pulse 89   Temp 98 F (36.7 C) (Oral)   Resp 18   Ht 6\' 6"  (1.981 m)   Wt 114.3 kg   SpO2 97%   BMI 29.12 kg/m   Physical Exam Vitals and nursing note reviewed.  Constitutional:      General: He is not in acute distress.    Appearance: Normal appearance. He is not ill-appearing, toxic-appearing or diaphoretic.  HENT:     Head: Normocephalic and atraumatic.     Nose: Nose normal.     Mouth/Throat:     Mouth: Mucous membranes are moist.  Eyes:       General:        Right eye: No discharge.        Left eye: No discharge.     Extraocular Movements: Extraocular movements intact.     Right eye: Normal extraocular motion.     Left eye: Normal extraocular motion.     Conjunctiva/sclera: Conjunctivae normal.     Right eye: Right conjunctiva is not injected. No chemosis, exudate or hemorrhage.    Left eye: Left conjunctiva is not injected. No chemosis, exudate or hemorrhage.    Pupils: Pupils are equal, round, and reactive to light.   Pulmonary:     Effort: Pulmonary effort is normal.  Skin:    General: Skin is dry.  Neurological:     Mental Status: He is alert.  Psychiatric:        Mood and Affect: Mood normal.     ED Results / Procedures / Treatments   Labs (all labs ordered are listed, but only abnormal results are displayed) Labs Reviewed - No data to display  EKG None  Radiology No results found.  Procedures Procedures (including critical care time)  Medications Ordered in ED Medications  fluorescein 1 MG ophthalmic strip (has no administration in time range)  tetracaine (PONTOCAINE) 0.5 % ophthalmic solution (has no administration in time range)    ED Course  I have reviewed the triage vital signs and the nursing notes.  Pertinent labs & imaging results that were available during my care of the patient were reviewed by me and considered in my medical decision making (see chart for details).    MDM Rules/Calculators/A&P                      Based on review of vitals, medical screening exam, lab work and/or imaging, there does not appear to be an acute, emergent etiology for the patient's symptoms. Counseled pt on good return precautions and encouraged both PCP and ED follow-up as needed.  Prior to discharge, I also discussed incidental imaging findings with patient in detail and advised appropriate, recommended follow-up in detail.  Clinical Impression: 1. Hordeolum internum of left upper eyelid      Disposition: Discharge  Prior to providing a prescription for a controlled substance, I independently reviewed the patient's recent prescription history on the [place]. The patient had no recent or regular prescriptions and was deemed appropriate for a brief, less than 3 day prescription of narcotic for acute analgesia.  This note was prepared with assistance of Systems analyst. Occasional wrong-word or sound-a-like substitutions may have occurred due to the inherent  limitations of voice recognition software.  Final Clinical Impression(s) / ED Diagnoses Final diagnoses:  Hordeolum internum of left upper eyelid    Rx / DC Orders ED Discharge Orders         Ordered    erythromycin ophthalmic ointment     04/04/19 1745           Kristine Royal 04/04/19 1746    Wyvonnia Dusky, MD 04/04/19 2134

## 2019-04-04 NOTE — ED Triage Notes (Signed)
Pt c/o L eye pain and swelling since yesterday with mild blurred vision.

## 2019-11-16 IMAGING — US US EXTREM LOW VENOUS*L*
1 series · 13 of 24 positions shown · non-contrast
Comparison: 11/25/2016

CLINICAL DATA: Left ankle pain and swelling. History of superficial
thrombophlebitis based on ultrasound from 11/25/2016.



[Series 1: us extrem low venous*left* · 0.09mm/px · 13 of 33 slices shown]
[im 1/33]
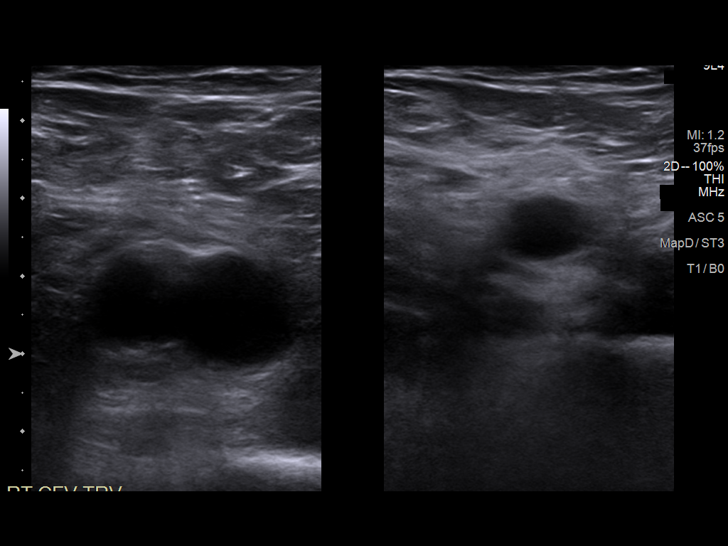
[im 3/33]
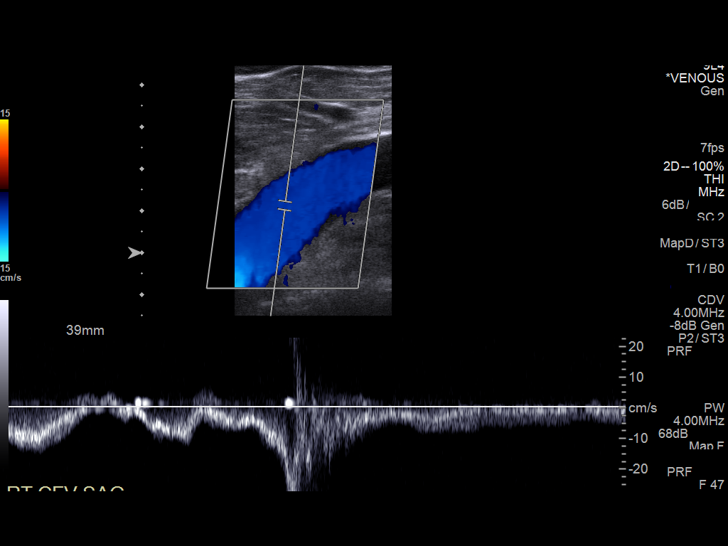
[im 6/33]
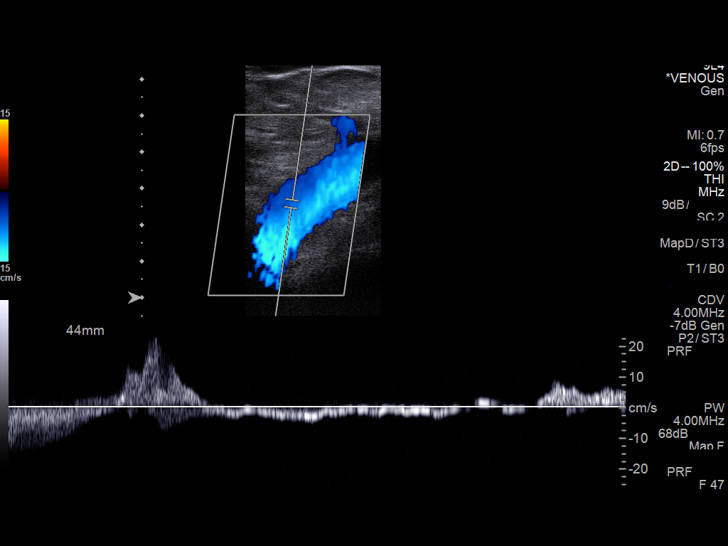
[im 9/33]
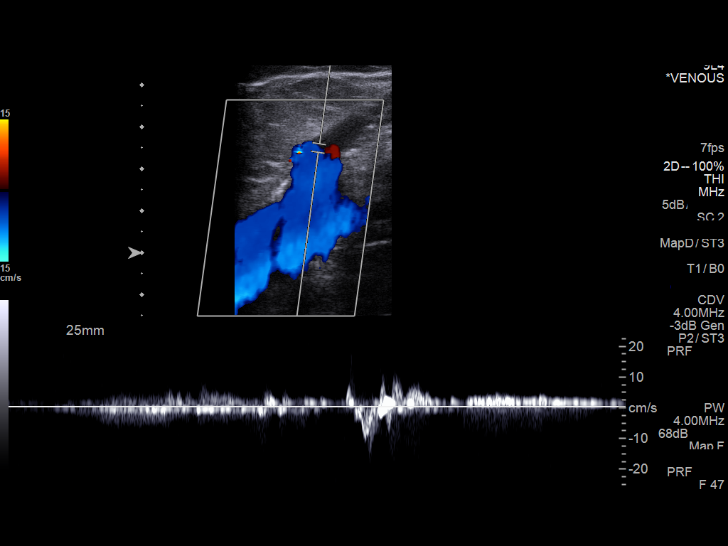
[im 12/33]
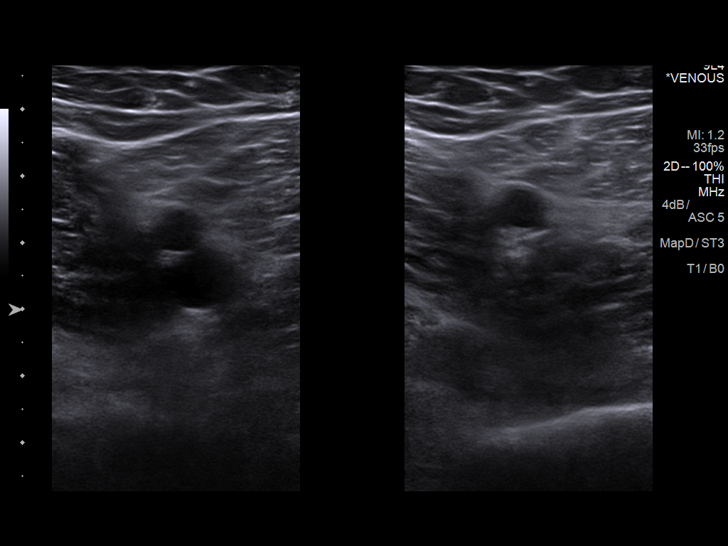
[im 14/33]
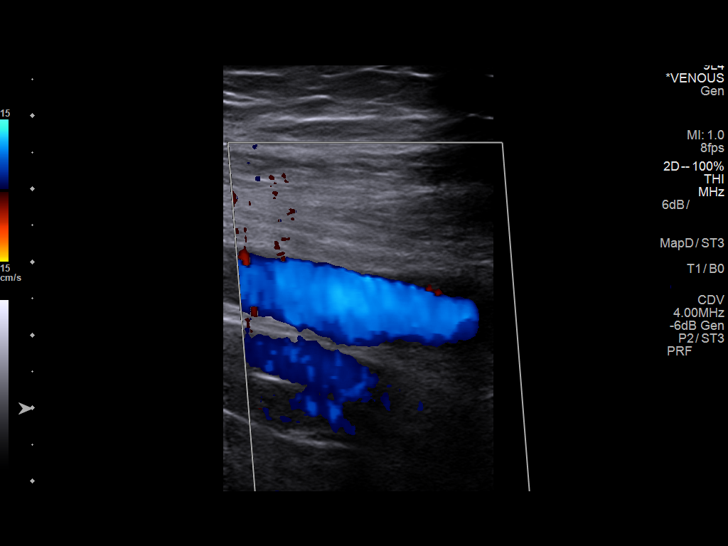
[im 17/33]
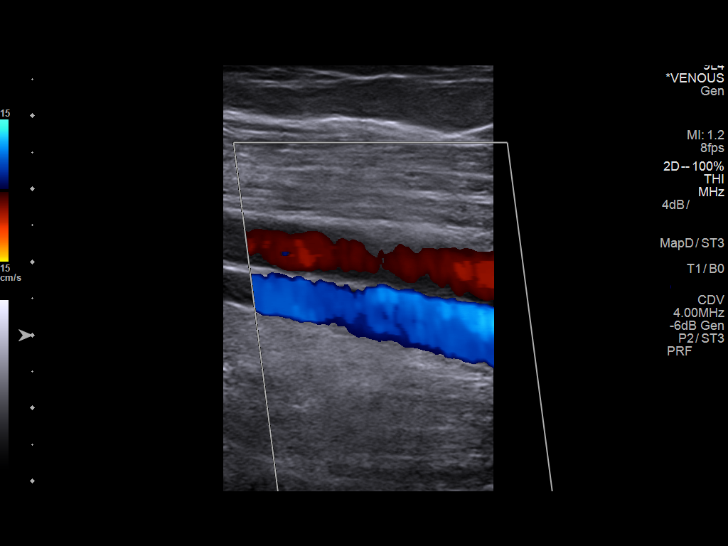
[im 19/33]
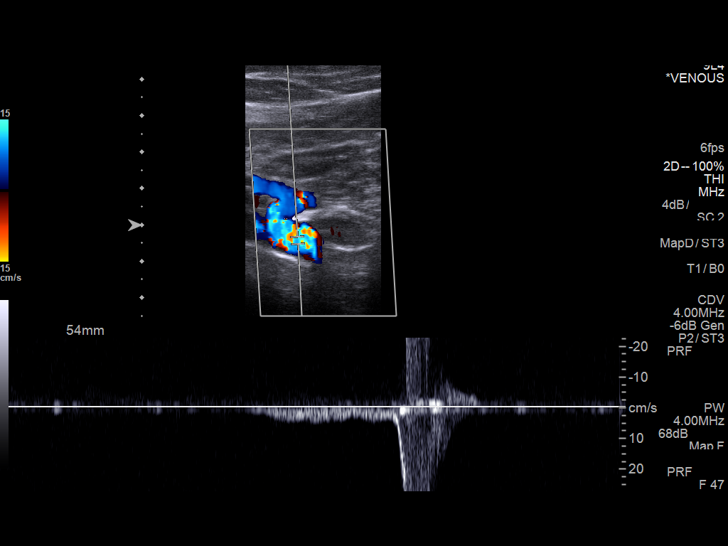
[im 21/33]
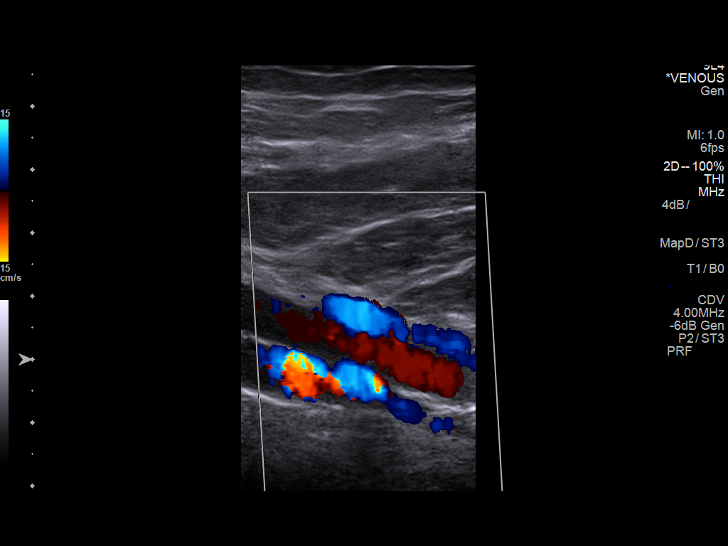
[im 24/33]
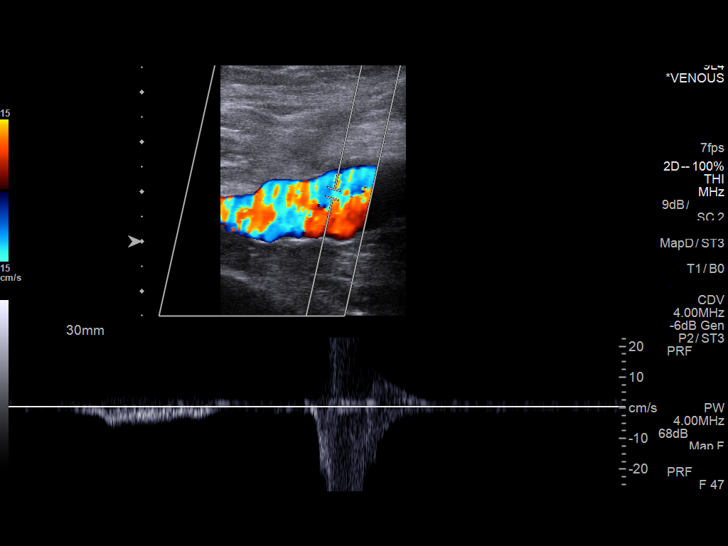
[im 27/33]
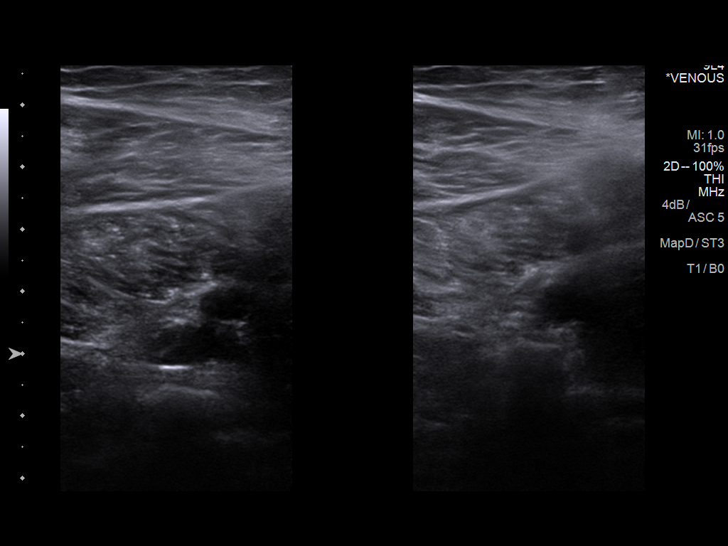
[im 30/33]
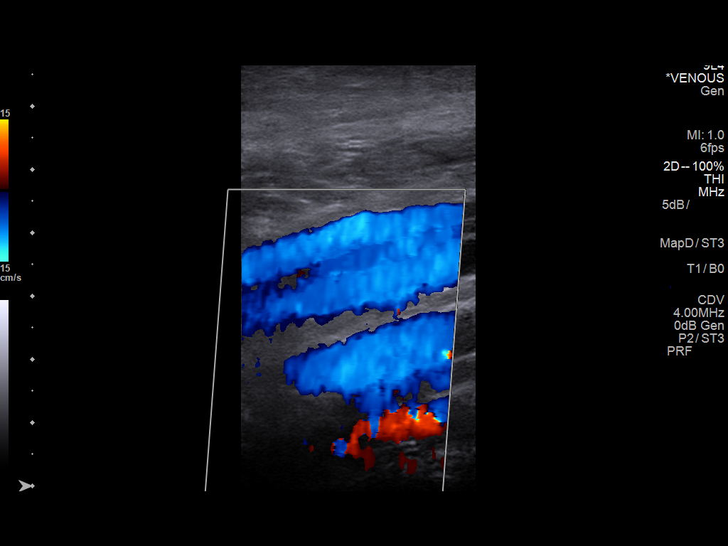
[im 33/33]
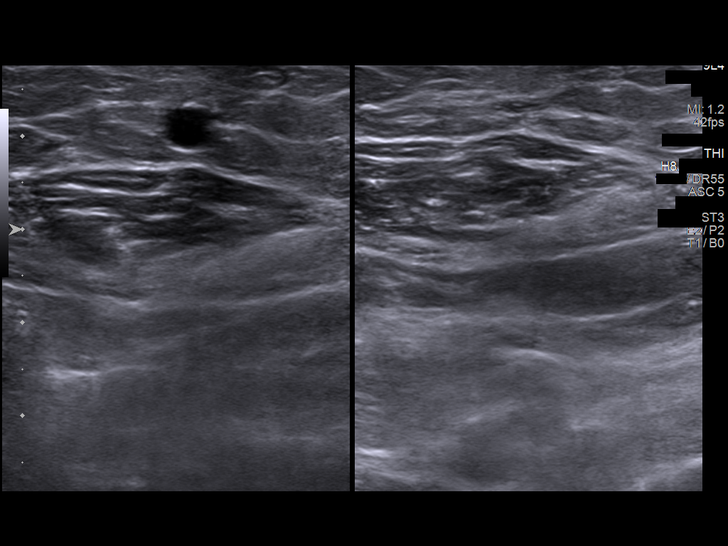

[13 of 24 positions shown; findings below may reference images not displayed]

FINDINGS: Contralateral Common Femoral Vein: Respiratory phasicity is normal
and symmetric with the symptomatic side. No evidence of thrombus.
Normal compressibility.

Common Femoral Vein: No evidence of thrombus. Normal
compressibility, respiratory phasicity and response to augmentation.

Saphenofemoral Junction: No evidence of thrombus. Normal
compressibility and flow on color Doppler imaging.

Profunda Femoral Vein: No evidence of thrombus. Normal
compressibility and flow on color Doppler imaging.

Femoral Vein: No evidence of thrombus. Normal compressibility,
respiratory phasicity and response to augmentation.

Popliteal Vein: No evidence of thrombus. Normal compressibility,
respiratory phasicity and response to augmentation.

Calf Veins: No evidence of thrombus. Normal compressibility and flow
on color Doppler imaging.

Superficial Great Saphenous Vein: No evidence of thrombus. Normal
compressibility. There may be wall thickening involving the GSV and
a GSV branch at the ankle. This may be related to previous thrombus
but no evidence for acute thrombus.

Venous Reflux:  None.

Other Findings:  Subcutaneous edema in left ankle.
IMPRESSION: No evidence of deep venous thrombosis.

Mild wall thickening involving the left GSV and GSV branch in the
left ankle probably related to previous thrombophlebitis. No
evidence for acute superficial thrombosis.

Subcutaneous edema in the left ankle.

## 2020-02-01 ENCOUNTER — Encounter (HOSPITAL_BASED_OUTPATIENT_CLINIC_OR_DEPARTMENT_OTHER): Payer: Self-pay | Admitting: *Deleted

## 2020-02-01 ENCOUNTER — Other Ambulatory Visit: Payer: Self-pay

## 2020-02-01 DIAGNOSIS — W108XXA Fall (on) (from) other stairs and steps, initial encounter: Secondary | ICD-10-CM | POA: Insufficient documentation

## 2020-02-01 DIAGNOSIS — E119 Type 2 diabetes mellitus without complications: Secondary | ICD-10-CM | POA: Diagnosis not present

## 2020-02-01 DIAGNOSIS — S60811A Abrasion of right wrist, initial encounter: Secondary | ICD-10-CM | POA: Insufficient documentation

## 2020-02-01 DIAGNOSIS — J45909 Unspecified asthma, uncomplicated: Secondary | ICD-10-CM | POA: Diagnosis not present

## 2020-02-01 DIAGNOSIS — Z794 Long term (current) use of insulin: Secondary | ICD-10-CM | POA: Insufficient documentation

## 2020-02-01 DIAGNOSIS — S93402A Sprain of unspecified ligament of left ankle, initial encounter: Secondary | ICD-10-CM | POA: Diagnosis not present

## 2020-02-01 DIAGNOSIS — S0093XA Contusion of unspecified part of head, initial encounter: Secondary | ICD-10-CM | POA: Diagnosis not present

## 2020-02-01 DIAGNOSIS — S99912A Unspecified injury of left ankle, initial encounter: Secondary | ICD-10-CM | POA: Diagnosis present

## 2020-02-01 NOTE — ED Triage Notes (Signed)
He slipped on his steps and fell 2 times, 2 days ago. Multiple abrasions and bruises noted. He is ambulatory to triage.

## 2020-02-02 ENCOUNTER — Emergency Department (HOSPITAL_BASED_OUTPATIENT_CLINIC_OR_DEPARTMENT_OTHER)
Admission: EM | Admit: 2020-02-02 | Discharge: 2020-02-02 | Disposition: A | Discharge status: Home / Self Care | Payer: BC Managed Care – PPO | Attending: Emergency Medicine | Admitting: Emergency Medicine

## 2020-02-02 DIAGNOSIS — T07XXXA Unspecified multiple injuries, initial encounter: Secondary | ICD-10-CM

## 2020-02-02 DIAGNOSIS — W19XXXA Unspecified fall, initial encounter: Secondary | ICD-10-CM

## 2020-02-02 DIAGNOSIS — S93402A Sprain of unspecified ligament of left ankle, initial encounter: Secondary | ICD-10-CM

## 2020-02-02 DIAGNOSIS — T148XXA Other injury of unspecified body region, initial encounter: Secondary | ICD-10-CM

## 2020-02-02 HISTORY — DX: Type 2 diabetes mellitus without complications: E11.9

## 2020-02-02 MED ORDER — METHOCARBAMOL 500 MG PO TABS: 500.0000 mg | ORAL_TABLET | Freq: Three times a day (TID) | ORAL | 0 refills | Status: DC | PRN

## 2020-02-02 NOTE — ED Provider Notes (Signed)
[facility] EMERGENCY DEPARTMENT Provider Note   CSN: [id number] Arrival date & time: 02/01/20  2257     History Chief Complaint  Patient presents with  . Fall    Francisco Lawson is a 49 y.o. male.  HPI Patient presents after fall.  2 days ago he fell down the stairs twice.  States first he had slipped on part of it and heard over most of his body.  Bruising particular left buttock area and abrasion to right wrist.  States he had another fall the next day.  That point it was a rolled left ankle.  Since then continued pain.  No numbness or weakness.  Not on anticoagulation.  Able to ambulate.  No difficulty breathing.  No abdominal pain.    Past Medical History:  Diagnosis Date  . Asthma   . Diabetes mellitus without complication ([facility])   . DVT (deep venous thrombosis) ([facility]) 11/12/2016  . Erythema multiforme   . Pneumothorax   . Stevens-Johnson syndrome (HCC)    from ASA and NSAIDs    Patient Active Problem List   Diagnosis Date Noted  . Left foot pain 08/11/2012  . Right knee pain 08/11/2012    Past Surgical History:  Procedure Laterality Date  . Biapical lung resection  2010  . CHEST TUBE INSERTION    . KNEE ARTHROSCOPY Right   . LYMPH GLAND EXCISION         Family History  Problem Relation Age of Onset  . Heart disease Father   . Early death Father   . Hypertension Father   . Hyperlipidemia Father   . Heart attack Father   . Sudden death Father   . Heart disease Mother   . Hypertension Mother   . Cancer Mother   . Arthritis Mother     Social History   Tobacco Use  . Smoking status: Never Smoker  . Smokeless tobacco: Never Used  Substance Use Topics  . Alcohol use: Yes    Comment: RARE  . Drug use: No    Home Medications Prior to Admission medications   Medication Sig Start Date End Date Taking? Authorizing Provider  Dulaglutide (TRULICITY) 1.5 DD/2.2GU SOPN INJECT 1 SUBCUTANEOUSLY ONCE A WEEK 08/25/19  Yes [provider]   insulin detemir (LEVEMIR) 100 UNIT/ML FlexPen Inject 10 units daily, pt to check fasting blood sugar daily and increase dose by 4 units every 3 days till fasting blood sugar levels are between 80 - 130 Maximum dose 30 units daily till reassessed per provider 09/30/19  Yes [provider]  methocarbamol (ROBAXIN) 500 MG tablet Take 1 tablet (500 mg total) by mouth every 8 (eight) hours as needed for muscle spasms. 02/02/20  Yes Davonna Belling, MD  cephALEXin (KEFLEX) 500 MG capsule Take 1 capsule (500 mg total) by mouth 4 (four) times daily. 01/05/19   Petrucelli, Samantha R, PA-C  cetirizine (ZYRTEC) 10 MG tablet Take 10 mg by mouth daily as needed for allergies or rhinitis.     [provider]  hydrocortisone cream 1 % Apply 1 application topically daily as needed for itching.    [provider]  albuterol (PROVENTIL HFA;VENTOLIN HFA) 108 (90 Base) MCG/ACT inhaler Inhale 1-2 puffs into the lungs every 6 (six) hours as needed for wheezing. Patient not taking: Reported on 03/19/2017 04/08/16 01/05/19  Larene Pickett, PA-C  sucralfate (CARAFATE) 1 GM/10ML suspension Take 10 mLs (1 g total) by mouth 4 (four) times daily -  with meals  and at bedtime. Patient not taking: Reported on 03/19/2017 02/09/15 01/05/19  Palumbo, April, MD    Allergies    Aspirin, Excedrin migraine [aspirin-acetaminophen-caffeine], Nsaids, and Tolmetin  Review of Systems   Review of Systems  Constitutional: Negative for appetite change.  HENT: Negative for congestion.   Respiratory: Negative for shortness of breath.   Cardiovascular: Negative for chest pain.  Gastrointestinal: Negative for abdominal pain.  Genitourinary: Negative for flank pain.  Musculoskeletal: Negative for back pain.       Left ankle pain  Skin: Positive for wound.  Neurological: Negative for light-headedness.  Hematological: Does not bruise/bleed easily.  Psychiatric/Behavioral: Negative for confusion.    Physical  Exam Updated Vital Signs BP (!) 152/99   Pulse 90   Temp 98 F (36.7 C)   Resp 17   Ht 6\' 6"  (1.981 m)   Wt 110.9 kg   SpO2 100%   BMI 28.25 kg/m   Physical Exam Vitals and nursing note reviewed.  HENT:     Head: Normocephalic.  Eyes:     Extraocular Movements: Extraocular movements intact.     Pupils: Pupils are equal, round, and reactive to light.  Cardiovascular:     Rate and Rhythm: Regular rhythm.  Pulmonary:     Breath sounds: No wheezing or rhonchi.  Abdominal:     Tenderness: There is no abdominal tenderness.  Musculoskeletal:     Cervical back: Neck supple.     Comments: Mild swelling of left ankle laterally and anteriorly.  No posterior tenderness on fibula.  Mild bruising on left buttock without underlying bony tenderness.  Abrasions to bilateral wrists without underlying bony tenderness.  Abrasion to right elbow without underlying bony tenderness.  Skin:    General: Skin is warm.     Capillary Refill: Capillary refill takes less than 2 seconds.  Neurological:     Mental Status: He is oriented to person, place, and time.     ED Results / Procedures / Treatments   Labs (all labs ordered are listed, but only abnormal results are displayed) Labs Reviewed - No data to display  EKG None  Radiology No results found.  Procedures Procedures (including critical care time)  Medications Ordered in ED Medications - No data to display  ED Course  I have reviewed the triage vital signs and the nursing notes.  Pertinent labs & imaging re sults that were available during my care of the patient were reviewed    Patient with fall few days ago.  Various musculoskeletal contusions and abrasions.  However does not appear to need imaging at this time.  Doubt severe intrathoracic intracranial hemorrhage abdominal injury.  Sprain of left ankle but does not appear to need x-ray.  Abrasions do not look like they have underlying severe injuries.  Will discharge home Final  Clinical Impression(s) / ED Diagnoses Final diagnoses:  Fall, initial encounter  Abrasion  Multiple contusions  Sprain of left ankle, unspecified ligament, initial encounter    Rx / DC Orders ED Discharge Orders         Ordered    methocarbamol (ROBAXIN) 500 MG tablet  Every 8 hours PRN        02/02/20 1610           Davonna Belling, MD 02/02/20 [phone]

## 2021-01-02 DIAGNOSIS — R200 Anesthesia of skin: Secondary | ICD-10-CM | POA: Diagnosis not present

## 2021-01-02 DIAGNOSIS — R202 Paresthesia of skin: Secondary | ICD-10-CM | POA: Diagnosis not present

## 2021-01-02 DIAGNOSIS — M542 Cervicalgia: Secondary | ICD-10-CM | POA: Diagnosis not present

## 2021-01-23 DIAGNOSIS — M542 Cervicalgia: Secondary | ICD-10-CM | POA: Diagnosis not present

## 2021-01-23 DIAGNOSIS — R202 Paresthesia of skin: Secondary | ICD-10-CM | POA: Diagnosis not present

## 2021-01-23 DIAGNOSIS — G5621 Lesion of ulnar nerve, right upper limb: Secondary | ICD-10-CM | POA: Diagnosis not present

## 2021-01-23 DIAGNOSIS — R200 Anesthesia of skin: Secondary | ICD-10-CM | POA: Diagnosis not present

## 2021-02-23 DIAGNOSIS — R200 Anesthesia of skin: Secondary | ICD-10-CM | POA: Diagnosis not present

## 2021-02-23 DIAGNOSIS — R29898 Other symptoms and signs involving the musculoskeletal system: Secondary | ICD-10-CM | POA: Diagnosis not present

## 2021-02-23 DIAGNOSIS — G5621 Lesion of ulnar nerve, right upper limb: Secondary | ICD-10-CM | POA: Diagnosis not present

## 2021-02-23 DIAGNOSIS — M542 Cervicalgia: Secondary | ICD-10-CM | POA: Diagnosis not present

## 2021-03-02 DIAGNOSIS — E119 Type 2 diabetes mellitus without complications: Secondary | ICD-10-CM | POA: Diagnosis not present

## 2021-03-02 DIAGNOSIS — L290 Pruritus ani: Secondary | ICD-10-CM | POA: Diagnosis not present

## 2021-03-02 DIAGNOSIS — G43009 Migraine without aura, not intractable, without status migrainosus: Secondary | ICD-10-CM | POA: Diagnosis not present

## 2021-03-02 DIAGNOSIS — Z794 Long term (current) use of insulin: Secondary | ICD-10-CM | POA: Diagnosis not present

## 2021-03-02 DIAGNOSIS — E7800 Pure hypercholesterolemia, unspecified: Secondary | ICD-10-CM | POA: Diagnosis not present

## 2021-03-03 IMAGING — US US EXTREM LOW VENOUS*L*
1 series · 13 of 24 positions shown · non-contrast
Comparison: None.

CLINICAL DATA: Left leg pain for 2 days



[Series 1: us extrem low venous*left* · 13 of 44 slices shown]
[im 1/44]
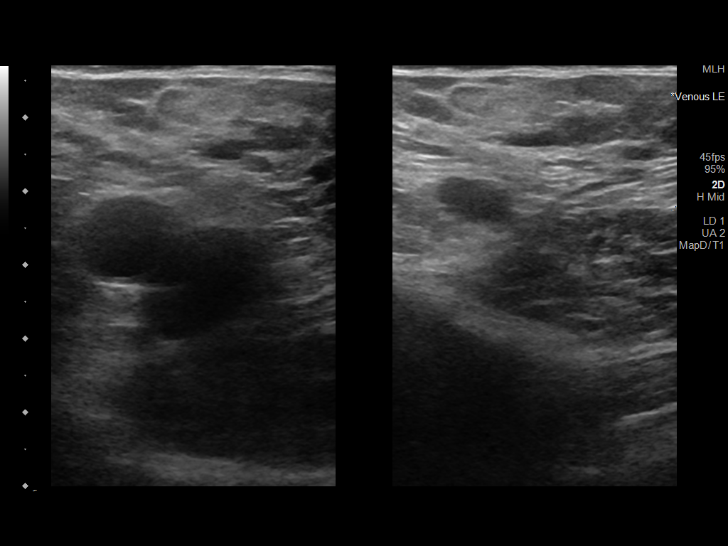
[im 4/44]
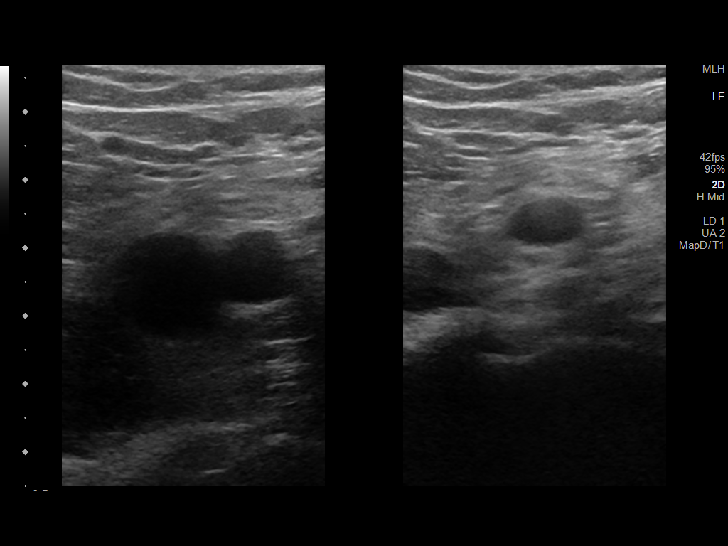
[im 8/44]
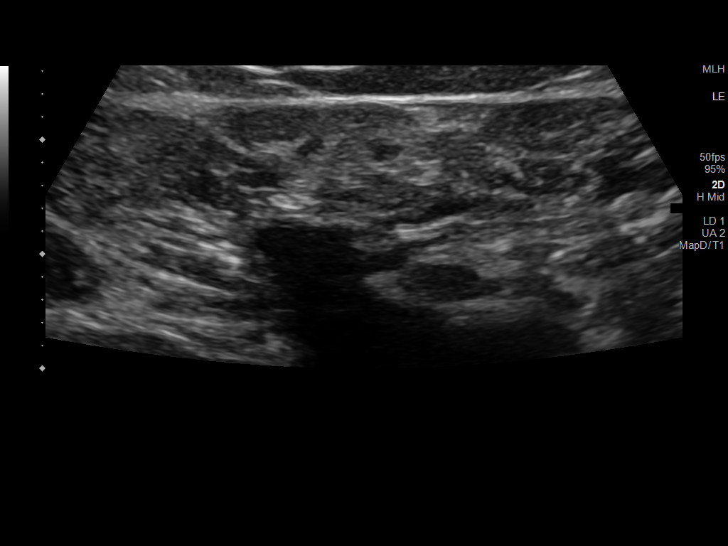
[im 12/44]
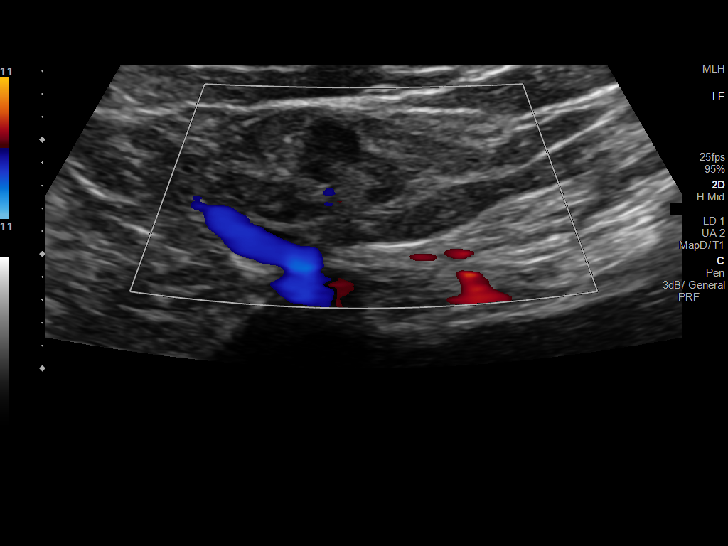
[im 15/44]
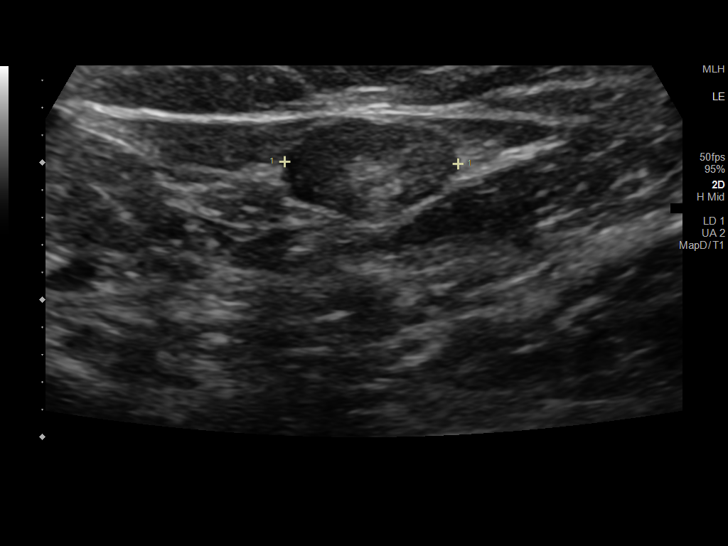
[im 19/44]
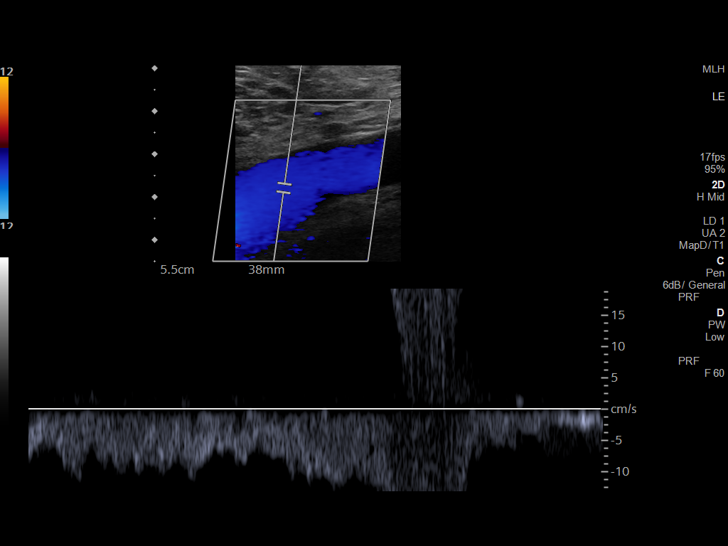
[im 23/44]
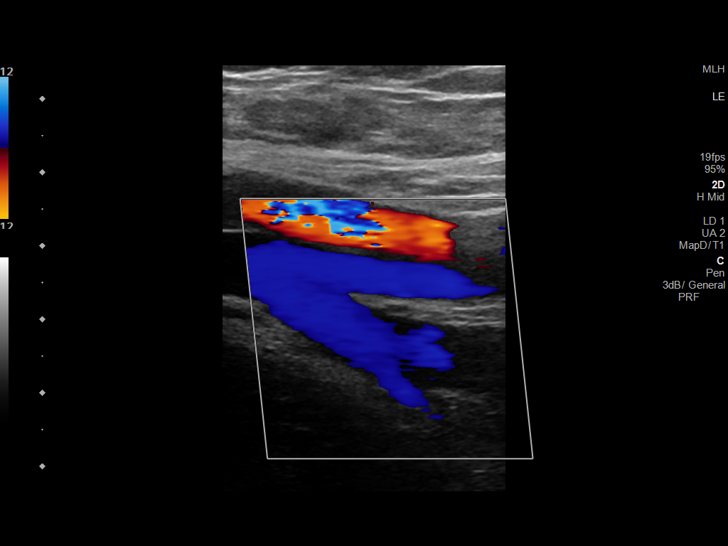
[im 25/44]
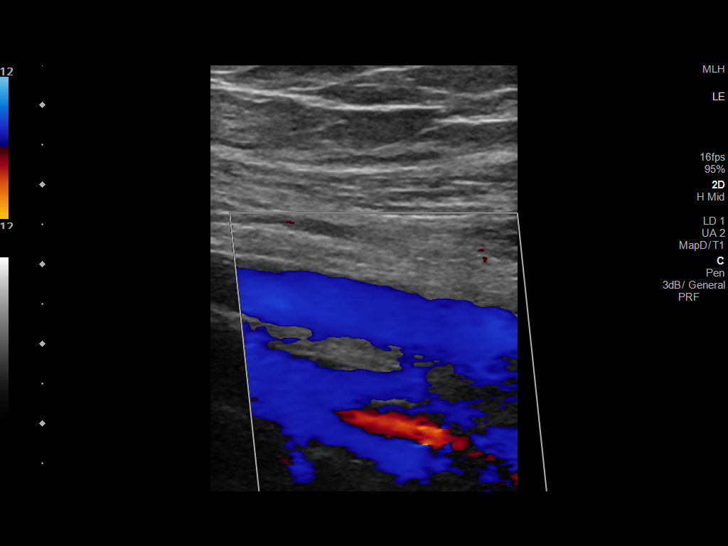
[im 29/44]
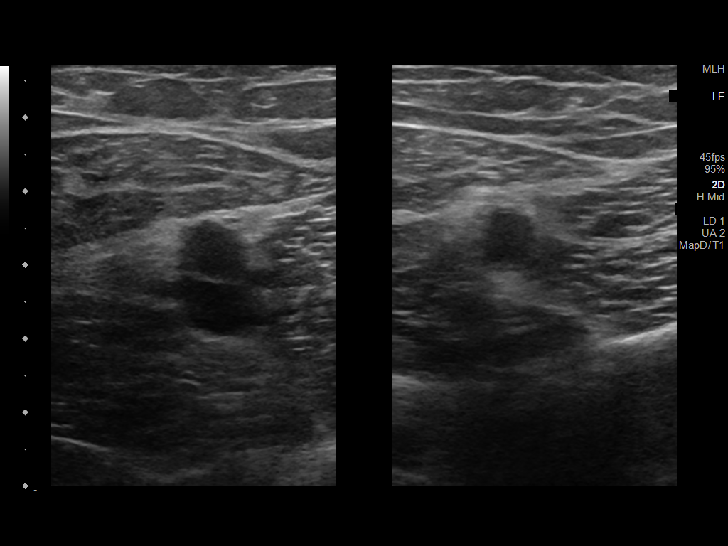
[im 32/44]
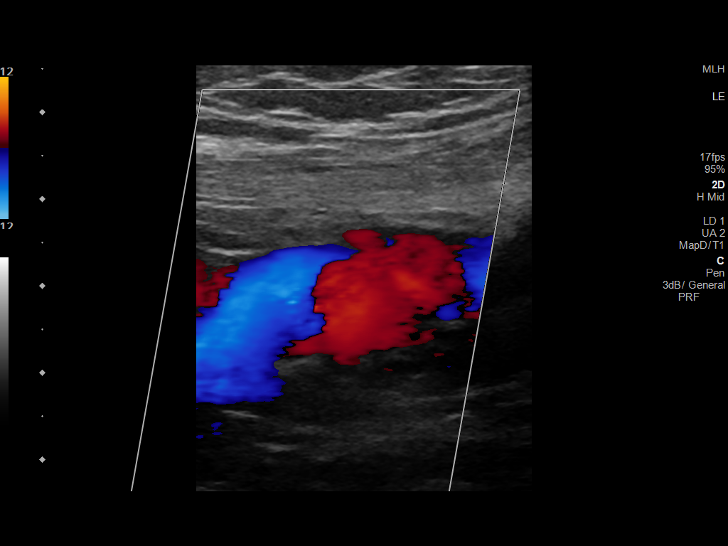
[im 36/44]
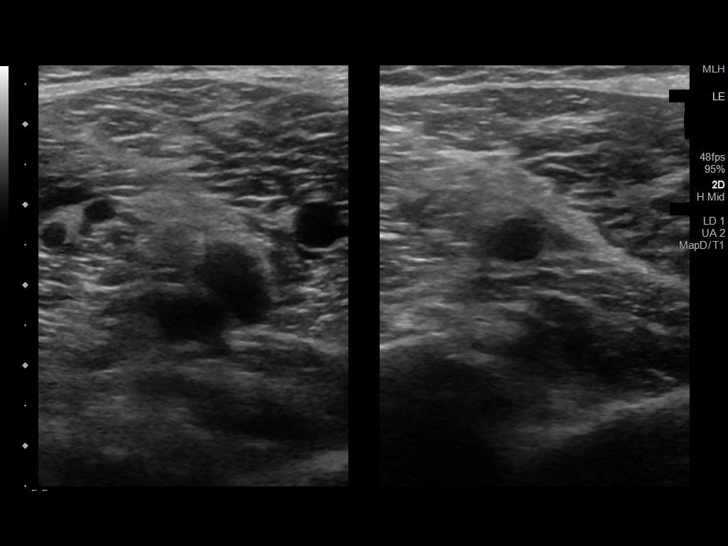
[im 40/44]
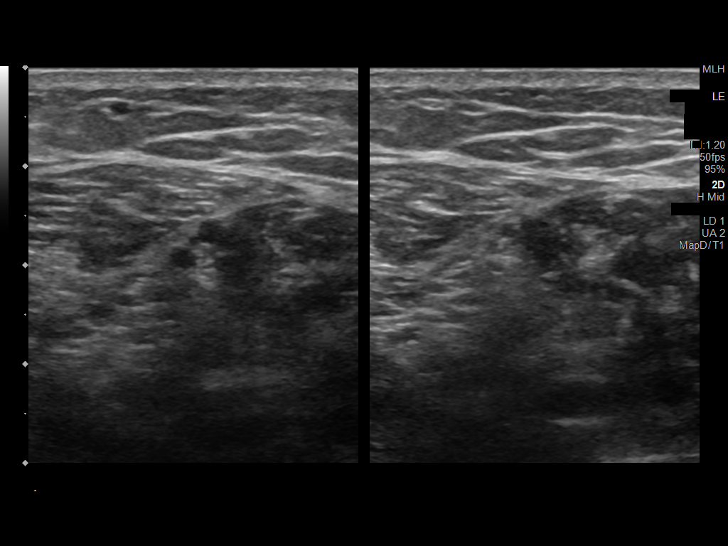
[im 44/44]
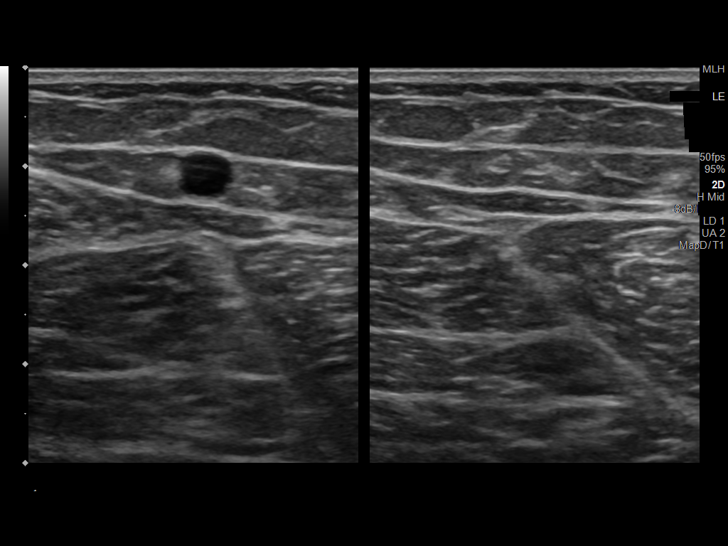

[13 of 24 positions shown; findings below may reference images not displayed]

FINDINGS: Contralateral Common Femoral Vein: Respiratory phasicity is normal
and symmetric with the symptomatic side. No evidence of thrombus.
Normal compressibility.

Common Femoral Vein: No evidence of thrombus. Normal
compressibility, respiratory phasicity and response to augmentation.

Saphenofemoral Junction: No evidence of thrombus. Normal
compressibility and flow on color Doppler imaging.

Profunda Femoral Vein: No evidence of thrombus. Normal
compressibility and flow on color Doppler imaging.

Femoral Vein: No evidence of thrombus. Normal compressibility,
respiratory phasicity and response to augmentation.

Popliteal Vein: No evidence of thrombus. Normal compressibility,
respiratory phasicity and response to augmentation.

Calf Veins: No evidence of thrombus. Normal compressibility and flow
on color Doppler imaging.

Superficial Great Saphenous Vein: No evidence of thrombus. Normal
compressibility.

Venous Reflux:  None.

Other Findings: Within the left groin there are scattered lymph
nodes the largest measuring 2.4 x 1.2 x 2.4 cm.
IMPRESSION: No evidence of deep venous thrombosis.

Nonspecific left groin adenopathy, which could be reactive.

## 2021-03-15 DIAGNOSIS — Z1211 Encounter for screening for malignant neoplasm of colon: Secondary | ICD-10-CM | POA: Diagnosis not present

## 2021-03-15 DIAGNOSIS — L290 Pruritus ani: Secondary | ICD-10-CM | POA: Diagnosis not present

## 2021-03-16 DIAGNOSIS — M5412 Radiculopathy, cervical region: Secondary | ICD-10-CM | POA: Diagnosis not present

## 2021-03-16 DIAGNOSIS — Z7952 Long term (current) use of systemic steroids: Secondary | ICD-10-CM | POA: Diagnosis not present

## 2021-03-16 DIAGNOSIS — M542 Cervicalgia: Secondary | ICD-10-CM | POA: Diagnosis not present

## 2021-03-26 DIAGNOSIS — M5412 Radiculopathy, cervical region: Secondary | ICD-10-CM | POA: Diagnosis not present

## 2021-03-26 DIAGNOSIS — M542 Cervicalgia: Secondary | ICD-10-CM | POA: Diagnosis not present

## 2021-03-29 DIAGNOSIS — Z794 Long term (current) use of insulin: Secondary | ICD-10-CM | POA: Diagnosis not present

## 2021-03-29 DIAGNOSIS — Z1211 Encounter for screening for malignant neoplasm of colon: Secondary | ICD-10-CM | POA: Diagnosis not present

## 2021-03-29 DIAGNOSIS — K648 Other hemorrhoids: Secondary | ICD-10-CM | POA: Diagnosis not present

## 2021-03-29 DIAGNOSIS — E119 Type 2 diabetes mellitus without complications: Secondary | ICD-10-CM | POA: Diagnosis not present

## 2021-04-14 DIAGNOSIS — M5412 Radiculopathy, cervical region: Secondary | ICD-10-CM | POA: Diagnosis not present

## 2021-05-01 DIAGNOSIS — Z01812 Encounter for preprocedural laboratory examination: Secondary | ICD-10-CM | POA: Diagnosis not present

## 2021-05-01 DIAGNOSIS — Z01810 Encounter for preprocedural cardiovascular examination: Secondary | ICD-10-CM | POA: Diagnosis not present

## 2021-05-01 DIAGNOSIS — Z01811 Encounter for preprocedural respiratory examination: Secondary | ICD-10-CM | POA: Diagnosis not present

## 2021-05-01 DIAGNOSIS — M4802 Spinal stenosis, cervical region: Secondary | ICD-10-CM | POA: Diagnosis not present

## 2021-05-01 DIAGNOSIS — M5412 Radiculopathy, cervical region: Secondary | ICD-10-CM | POA: Diagnosis not present

## 2021-05-06 HISTORY — PX: OTHER SURGICAL HISTORY: SHX169

## 2021-05-15 DIAGNOSIS — E119 Type 2 diabetes mellitus without complications: Secondary | ICD-10-CM | POA: Diagnosis not present

## 2021-05-15 DIAGNOSIS — I4510 Unspecified right bundle-branch block: Secondary | ICD-10-CM | POA: Diagnosis not present

## 2021-05-15 DIAGNOSIS — Z794 Long term (current) use of insulin: Secondary | ICD-10-CM | POA: Diagnosis not present

## 2021-05-15 DIAGNOSIS — I973 Postprocedural hypertension: Secondary | ICD-10-CM | POA: Diagnosis not present

## 2021-05-15 DIAGNOSIS — Z9889 Other specified postprocedural states: Secondary | ICD-10-CM | POA: Diagnosis not present

## 2021-05-15 DIAGNOSIS — E7800 Pure hypercholesterolemia, unspecified: Secondary | ICD-10-CM | POA: Diagnosis not present

## 2021-05-15 DIAGNOSIS — M5412 Radiculopathy, cervical region: Secondary | ICD-10-CM | POA: Diagnosis not present

## 2021-05-15 DIAGNOSIS — R079 Chest pain, unspecified: Secondary | ICD-10-CM | POA: Diagnosis not present

## 2021-05-15 DIAGNOSIS — I440 Atrioventricular block, first degree: Secondary | ICD-10-CM | POA: Diagnosis not present

## 2021-05-15 DIAGNOSIS — M4802 Spinal stenosis, cervical region: Secondary | ICD-10-CM | POA: Diagnosis not present

## 2021-05-15 DIAGNOSIS — Z981 Arthrodesis status: Secondary | ICD-10-CM | POA: Diagnosis not present

## 2021-05-15 DIAGNOSIS — M4322 Fusion of spine, cervical region: Secondary | ICD-10-CM | POA: Diagnosis not present

## 2021-05-15 DIAGNOSIS — I10 Essential (primary) hypertension: Secondary | ICD-10-CM | POA: Diagnosis not present

## 2021-05-16 DIAGNOSIS — M4322 Fusion of spine, cervical region: Secondary | ICD-10-CM | POA: Diagnosis not present

## 2021-05-16 DIAGNOSIS — R079 Chest pain, unspecified: Secondary | ICD-10-CM | POA: Diagnosis not present

## 2021-05-30 DIAGNOSIS — Z981 Arthrodesis status: Secondary | ICD-10-CM | POA: Diagnosis not present

## 2021-06-26 DIAGNOSIS — Z981 Arthrodesis status: Secondary | ICD-10-CM | POA: Diagnosis not present

## 2021-06-26 DIAGNOSIS — M5030 Other cervical disc degeneration, unspecified cervical region: Secondary | ICD-10-CM | POA: Diagnosis not present

## 2021-07-17 DIAGNOSIS — H40013 Open angle with borderline findings, low risk, bilateral: Secondary | ICD-10-CM | POA: Diagnosis not present

## 2021-07-17 DIAGNOSIS — E119 Type 2 diabetes mellitus without complications: Secondary | ICD-10-CM | POA: Diagnosis not present

## 2021-08-09 DIAGNOSIS — M545 Low back pain, unspecified: Secondary | ICD-10-CM | POA: Diagnosis not present

## 2021-08-09 DIAGNOSIS — Z981 Arthrodesis status: Secondary | ICD-10-CM | POA: Diagnosis not present

## 2021-08-30 DIAGNOSIS — Z794 Long term (current) use of insulin: Secondary | ICD-10-CM | POA: Diagnosis not present

## 2021-08-30 DIAGNOSIS — R5381 Other malaise: Secondary | ICD-10-CM | POA: Diagnosis not present

## 2021-08-30 DIAGNOSIS — E119 Type 2 diabetes mellitus without complications: Secondary | ICD-10-CM | POA: Diagnosis not present

## 2021-08-30 DIAGNOSIS — E7800 Pure hypercholesterolemia, unspecified: Secondary | ICD-10-CM | POA: Diagnosis not present

## 2021-11-13 DIAGNOSIS — M4802 Spinal stenosis, cervical region: Secondary | ICD-10-CM | POA: Diagnosis not present

## 2021-11-13 DIAGNOSIS — Z981 Arthrodesis status: Secondary | ICD-10-CM | POA: Diagnosis not present

## 2021-11-14 ENCOUNTER — Emergency Department (HOSPITAL_BASED_OUTPATIENT_CLINIC_OR_DEPARTMENT_OTHER)
Admission: EM | Admit: 2021-11-14 | Discharge: 2021-11-15 | Disposition: A | Discharge status: Home / Self Care | Payer: BC Managed Care – PPO | Attending: Emergency Medicine | Admitting: Emergency Medicine

## 2021-11-14 ENCOUNTER — Encounter (HOSPITAL_BASED_OUTPATIENT_CLINIC_OR_DEPARTMENT_OTHER): Payer: Self-pay | Admitting: Emergency Medicine

## 2021-11-14 ENCOUNTER — Other Ambulatory Visit: Payer: Self-pay

## 2021-11-14 DIAGNOSIS — R519 Headache, unspecified: Secondary | ICD-10-CM | POA: Diagnosis not present

## 2021-11-14 DIAGNOSIS — Z794 Long term (current) use of insulin: Secondary | ICD-10-CM | POA: Diagnosis not present

## 2021-11-14 DIAGNOSIS — L0291 Cutaneous abscess, unspecified: Secondary | ICD-10-CM

## 2021-11-14 DIAGNOSIS — L03116 Cellulitis of left lower limb: Secondary | ICD-10-CM | POA: Diagnosis not present

## 2021-11-14 DIAGNOSIS — L02416 Cutaneous abscess of left lower limb: Secondary | ICD-10-CM | POA: Insufficient documentation

## 2021-11-14 DIAGNOSIS — M79652 Pain in left thigh: Secondary | ICD-10-CM | POA: Diagnosis not present

## 2021-11-14 MED ORDER — SULFAMETHOXAZOLE-TRIMETHOPRIM 800-160 MG PO TABS
1.0000 | ORAL_TABLET | Freq: Once | ORAL | Status: AC
  Administered 2021-11-14: 1 via ORAL
  Filled 2021-11-14: qty 1

## 2021-11-14 MED ORDER — SULFAMETHOXAZOLE-TRIMETHOPRIM 800-160 MG PO TABS: 1.0000 | ORAL_TABLET | Freq: Two times a day (BID) | ORAL | 0 refills | Status: AC

## 2021-11-14 NOTE — ED Triage Notes (Signed)
Patient c/o possible boil to left inner thigh that appeared today. Patient reports ringing in ears for 2 weeks, also c/o headache since last night.

## 2021-11-14 NOTE — Discharge Instructions (Signed)
You have an area of infected skin called cellulitis.  I have started you on antibiotics.  These antibiotics do not work instantaneously.   - You should allow at least 48 hours after your first dose for the cellulitis to slow it's spreading.   - You should allow 48 hours after the first dose to see the cellulitis to start improving.   - These things may happen quicker than that but allow at least these limitations prior to returning to the emergency department or your primary doctor.   - The exceptions to this are if you become systemically ill such as having a fever, nausea, vomiting, malaise.  - The other exception is if you have a rapid spreading of the cellulitis or the redness and swelling and pain.  If you notice more than a couple inches of spreading over a couple hours you need to return to the emergency department immediately because this could be a life-threatening infection. If you notice red streaking from the site you also need to return for reevaluation.  - If not, follow up with your primary doctor as instructed for reevaluation.   

## 2021-11-15 NOTE — ED Provider Notes (Signed)
[facility] EMERGENCY DEPARTMENT Provider Note   CSN: [id number] Arrival date & time: 11/14/21  2245     History  Chief Complaint  Patient presents with   Abscess    Francisco Lawson is a 51 y.o. male.  51 year old male who presents the ER today with left thigh pain and a headache.  Patient states that he has had some swelling and a small pimple-like area to his left inner thigh for couple days.  He states he took off the Band-Aid earlier today and a bunch of pus and blood came out so he came here for further evaluation.  He states he had spreading redness from there and swelling in his groin consistent with his lymph nodes.  No fevers, nausea, sleepiness.  Does have a headache from being awake late at night.   Abscess      Home Medications Prior to Admission medications   Medication Sig Start Date End Date Taking? Authorizing Provider  sulfamethoxazole-trimethoprim (BACTRIM DS) 800-160 MG tablet Take 1 tablet by mouth 2 (two) times daily for 10 days. 11/14/21 11/24/21 Yes Akosua Constantine, Corene Cornea, MD  cephALEXin (KEFLEX) 500 MG capsule Take 1 capsule (500 mg total) by mouth 4 (four) times daily. 01/05/19   Petrucelli, Samantha R, PA-C  cetirizine (ZYRTEC) 10 MG tablet Take 10 mg by mouth daily as needed for allergies or rhinitis.     [provider]  Dulaglutide (TRULICITY) 1.5 AC/1.6SA SOPN INJECT 1 SUBCUTANEOUSLY ONCE A WEEK 08/25/19   [provider]  hydrocortisone cream 1 % Apply 1 application topically daily as needed for itching.    [provider]  insulin detemir (LEVEMIR) 100 UNIT/ML FlexPen Inject 10 units daily, pt to check fasting blood sugar daily and increase dose by 4 units every 3 days till fasting blood sugar levels are between 80 - 130 Maximum dose 30 units daily till reassessed per provider 09/30/19   [provider]  methocarbamol (ROBAXIN) 500 MG tablet Take 1 tablet (500 mg total) by mouth every 8 (eight) hours as needed for  muscle spasms. 02/02/20   Davonna Belling, MD  albuterol (PROVENTIL HFA;VENTOLIN HFA) 108 (90 Base) MCG/ACT inhaler Inhale 1-2 puffs into the lungs every 6 (six) hours as needed for wheezing. Patient not taking: Reported on 03/19/2017 04/08/16 01/05/19  Larene Pickett, PA-C  sucralfate (CARAFATE) 1 GM/10ML suspension Take 10 mLs (1 g total) by mouth 4 (four) times daily -  with meals and at bedtime. Patient not taking: Reported on 03/19/2017 02/09/15 01/05/19  Palumbo, April, MD      Allergies    Aspirin, Excedrin migraine [aspirin-acetaminophen-caffeine], Nsaids, and Tolmetin    Review of Systems   Review of Systems  Physical Exam Updated Vital Signs BP 130/79   Pulse 89   Temp 98.1 F (36.7 C) (Oral)   Resp 18   Ht 6\' 6"  (1.981 m)   Wt 107 kg   SpO2 96%   BMI 27.27 kg/m  Physical Exam Vitals and nursing note reviewed.  Constitutional:      Appearance: He is well-developed.  HENT:     Head: Normocephalic and atraumatic.     Nose: No congestion or rhinorrhea.  Eyes:     Pupils: Pupils are equal, round, and reactive to light.  Cardiovascular:     Rate and Rhythm: Normal rate.  Pulmonary:     Effort: Pulmonary effort is normal. No respiratory distress.  Abdominal:     General: There is no distension.  Musculoskeletal:  General: Normal range of motion.     Cervical back: Normal range of motion.  Skin:    General: Skin is warm and dry.     Comments: Large erythematous area to his left inner thigh approximately 20 x 10 cm irregularly shaped with an indurated area in the middle but no fluctuance.  Has an open wound that has recently drained.  Neurological:     General: No focal deficit present.     Mental Status: He is alert.     ED Results / Procedures / Treatments   Labs (all labs ordered are listed, but only abnormal results are displayed) Labs Reviewed - No data to display  EKG None  Radiology No results found.  Procedures Procedures    Medications  Ordered in ED Medications  sulfamethoxazole-trimethoprim (BACTRIM DS) 800-160 MG per tablet 1 tablet (1 tablet Oral Given 11/14/21 2358)    ED Course/ Medical Decision Making/ A&P                           Medical Decision Making Risk Prescription drug management.   Suspect patient had an abscess earlier today that self expressed.  Informal bedside ultrasound done without any obvious fluid collection underneath.  This is consistent with his story of draining prior to coming to the ER.  He has no systemic signs of sepsis or other systemic infection.  We will treat with antibiotics.  No history of kidney problems does have diabetes but no a contraindication to Bactrim.  Area marked here and will follow it at home.  Instructions given for return if not improving in 72 hours.pain controlled.    Final Clinical Impression(s) / ED Diagnoses Final diagnoses:  Abscess  Cellulitis of left lower extremity    Rx / DC Orders ED Discharge Orders          Ordered    sulfamethoxazole-trimethoprim (BACTRIM DS) 800-160 MG tablet  2 times daily        11/14/21 2341              Arnett Duddy, Corene Cornea, MD 11/15/21 [id number]

## 2022-01-03 DIAGNOSIS — H9313 Tinnitus, bilateral: Secondary | ICD-10-CM | POA: Diagnosis not present

## 2022-01-03 DIAGNOSIS — R0981 Nasal congestion: Secondary | ICD-10-CM | POA: Diagnosis not present

## 2022-01-03 DIAGNOSIS — H918X3 Other specified hearing loss, bilateral: Secondary | ICD-10-CM | POA: Diagnosis not present

## 2022-02-26 DIAGNOSIS — Z981 Arthrodesis status: Secondary | ICD-10-CM | POA: Diagnosis not present

## 2022-02-26 DIAGNOSIS — M542 Cervicalgia: Secondary | ICD-10-CM | POA: Diagnosis not present

## 2022-03-05 DIAGNOSIS — Z794 Long term (current) use of insulin: Secondary | ICD-10-CM | POA: Diagnosis not present

## 2022-03-05 DIAGNOSIS — R5381 Other malaise: Secondary | ICD-10-CM | POA: Diagnosis not present

## 2022-03-05 DIAGNOSIS — E119 Type 2 diabetes mellitus without complications: Secondary | ICD-10-CM | POA: Diagnosis not present

## 2022-03-05 DIAGNOSIS — E7800 Pure hypercholesterolemia, unspecified: Secondary | ICD-10-CM | POA: Diagnosis not present

## 2022-03-06 DIAGNOSIS — Z125 Encounter for screening for malignant neoplasm of prostate: Secondary | ICD-10-CM | POA: Diagnosis not present

## 2022-03-06 DIAGNOSIS — E7801 Familial hypercholesterolemia: Secondary | ICD-10-CM | POA: Diagnosis not present

## 2022-03-06 DIAGNOSIS — E119 Type 2 diabetes mellitus without complications: Secondary | ICD-10-CM | POA: Diagnosis not present

## 2022-03-06 DIAGNOSIS — E7800 Pure hypercholesterolemia, unspecified: Secondary | ICD-10-CM | POA: Diagnosis not present

## 2022-08-07 ENCOUNTER — Encounter: Payer: Self-pay | Admitting: Family

## 2022-08-07 ENCOUNTER — Ambulatory Visit: Payer: BC Managed Care – PPO | Admitting: Family

## 2022-08-07 VITALS — BP 128/78 | HR 82 | Ht 78.0 in | Wt 225.2 lb

## 2022-08-07 DIAGNOSIS — L03112 Cellulitis of left axilla: Secondary | ICD-10-CM

## 2022-08-07 DIAGNOSIS — Z794 Long term (current) use of insulin: Secondary | ICD-10-CM

## 2022-08-07 DIAGNOSIS — E119 Type 2 diabetes mellitus without complications: Secondary | ICD-10-CM

## 2022-08-07 MED ORDER — DOXYCYCLINE HYCLATE 100 MG PO TABS: 100.0000 mg | ORAL_TABLET | Freq: Two times a day (BID) | ORAL | 0 refills | Status: DC

## 2022-08-07 NOTE — Progress Notes (Signed)
Francisco Lawson is a 52 y.o. male with the following history as recorded in Epic[employer]:  Patient Active Problem List   Diagnosis Date Noted   Left foot pain 08/11/2012   Right knee pain 08/11/2012    Current Outpatient Medications  Medication Sig Dispense Refill   doxycycline (VIBRA-TABS) 100 MG tablet Take 1 tablet (100 mg total) by mouth 2 (two) times daily. 14 tablet 0   hydrocortisone cream 1 % Apply 1 application topically daily as needed for itching.     Insulin Degludec (TRESIBA) 100 UNIT/ML SOLN Inject into the skin.     methocarbamol (ROBAXIN) 500 MG tablet Take 1 tablet (500 mg total) by mouth every 8 (eight) hours as needed for muscle spasms. 8 tablet 0   cetirizine (ZYRTEC) 10 MG tablet Take 10 mg by mouth daily as needed for allergies or rhinitis.  (Patient not taking: Reported on 08/07/2022)     No current facility-administered medications for this visit.    Allergies: Aspirin, Excedrin migraine [aspirin-acetaminophen-caffeine], Nsaids, Statins, and Tolmetin  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    Past Surgical History:  Procedure Laterality Date   Biapical lung resection  2010   CHEST TUBE INSERTION     KNEE ARTHROSCOPY Right    LYMPH GLAND EXCISION     SPINE SURGERY      Family History  Problem Relation Age of Onset   Heart disease Father    Early death Father    Hypertension Father    Hyperlipidemia Father    Heart attack Father    Sudden death Father    Heart disease Mother    Hypertension Mother    Cancer Mother    Arthritis Mother     Social History   Tobacco Use   Smoking status: Never   Smokeless tobacco: Never  Substance Use Topics   Alcohol use: Yes    Comment: RARE    Subjective:   Presents today as a new patient;   History of Type 2 Diabetes- started taking insulin Tresiba (30 units) for the past week because he was supposed to be on weekly Trulicity but has been unable to get his Trulicity since May 2024 due to backorder at the pharmacy; Does check his blood sugar daily- averaging 105-110;   Was previously prescribed Praluent- has not been able to get this medication filled; cannot tolerate any type of statin;   History of chronic Levonne Spiller Syndrome- started after taking Excedrin Migraine; cannot take any type of NSAIDs; has seen dermatology/ ID; can take antibiotics with no issues; is requesting antibiotics today to help with suspected cellulitis left axillary region;   Objective:  Vitals:   08/07/22 1443  BP: 128/78  Pulse: 82  SpO2: 97%  Weight: 225 lb 3.2 oz (102.2 kg)  Height: 6\' 6"  (1.981 m)    General: Well developed, well nourished, in no acute distress  Skin : Warm and dry.  Head: Normocephalic and atraumatic  Eyes: Sclera and conjunctiva clear; pupils round and reactive to light; extraocular movements intact  Ears: External normal; canals clear; tympanic membranes normal  Oropharynx: Pink, supple. No suspicious lesions  Neck: Supple without thyromegaly, adenopathy  Lungs: Respirations unlabored; clear to auscultation bilaterally without wheeze, rales,  rhonchi  CVS exam: normal rate and regular rhythm.  Neurologic: Alert and oriented; speech intact; face symmetrical; moves all extremities well; CNII-XII intact without focal deficit   Assessment:  1. Type 2 diabetes mellitus without complication, without long-term current use of insulin (HCC)   2. Cellulitis of left axilla     Plan:  Will update labs today to establish baseline to determine treatment options; per patient, he has not taken any type of medication for his diabetes for the past 3 months- has only taken insulin for the past week; will most likely plan to re-start Mounjaro and hold insulin as his previous provider's  notes recommended; he is unable to tolerate statin- to consider re-starting Praluent;  Rx for Doxycycline 100 mg bid x 7 days;   No follow-ups on file.  Orders Placed This Encounter  Procedures   CBC with Differential/Platelet   Comp Met (CMET)   Lipid panel   Hemoglobin A1c    Requested Prescriptions   Signed Prescriptions Disp Refills   doxycycline (VIBRA-TABS) 100 MG tablet 14 tablet 0    Sig: Take 1 tablet (100 mg total) by mouth 2 (two) times daily.

## 2022-08-08 ENCOUNTER — Other Ambulatory Visit: Payer: Self-pay | Admitting: Family

## 2022-08-08 LAB — COMPREHENSIVE METABOLIC PANEL
ALT: 9 U/L (ref 0–53)
AST: 16 U/L (ref 0–37)
Albumin: 4.5 g/dL (ref 3.5–5.2)
Alkaline Phosphatase: 83 U/L (ref 39–117)
BUN: 13 mg/dL (ref 6–23)
CO2: 31 mEq/L (ref 19–32)
Calcium: 10.2 mg/dL (ref 8.4–10.5)
Chloride: 105 mEq/L (ref 96–112)
Creatinine, Ser: 1.21 mg/dL (ref 0.40–1.50)
GFR: 70.26 mL/min (ref >60.00)
Glucose, Bld: 79 mg/dL (ref 70–99)
Potassium: 4.8 mEq/L (ref 3.5–5.1)
Sodium: 142 mEq/L (ref 135–145)
Total Bilirubin: 0.5 mg/dL (ref 0.2–1.2)
Total Protein: 7.4 g/dL (ref 6.0–8.3)

## 2022-08-08 LAB — LIPID PANEL
Cholesterol: 248 mg/dL — ABNORMAL HIGH (ref 0–200)
HDL: 37.1 mg/dL — ABNORMAL LOW (ref >39.00)
LDL Cholesterol: 192 mg/dL — ABNORMAL HIGH (ref 0–99)
NonHDL: 211.35
Total CHOL/HDL Ratio: 7
Triglycerides: 98 mg/dL (ref 0.0–149.0)
VLDL: 19.6 mg/dL (ref 0.0–40.0)

## 2022-08-08 LAB — CBC WITH DIFFERENTIAL/PLATELET
Basophils Absolute: 0 10*3/uL (ref 0.0–0.1)
Basophils Relative: 1 % (ref 0.0–3.0)
Eosinophils Absolute: 0.2 10*3/uL (ref 0.0–0.7)
Eosinophils Relative: 3.8 % (ref 0.0–5.0)
HCT: 41.3 % (ref 39.0–52.0)
Hemoglobin: 13.7 g/dL (ref 13.0–17.0)
Lymphocytes Relative: 39.2 % (ref 12.0–46.0)
Lymphs Abs: 1.7 10*3/uL (ref 0.7–4.0)
MCHC: 33.1 g/dL (ref 30.0–36.0)
MCV: 84.3 fl (ref 78.0–100.0)
Monocytes Absolute: 0.5 10*3/uL (ref 0.1–1.0)
Monocytes Relative: 11 % (ref 3.0–12.0)
Neutro Abs: 1.9 10*3/uL (ref 1.4–7.7)
Neutrophils Relative %: 45 % (ref 43.0–77.0)
Platelets: 428 10*3/uL — ABNORMAL HIGH (ref 150.0–400.0)
RBC: 4.89 Mil/uL (ref 4.22–5.81)
RDW: 12.4 % (ref 11.5–15.5)
WBC: 4.3 10*3/uL (ref 4.0–10.5)

## 2022-08-08 LAB — HEMOGLOBIN A1C: Hgb A1c MFr Bld: 7.8 % — ABNORMAL HIGH (ref 4.6–6.5)

## 2022-08-08 MED ORDER — TIRZEPATIDE 2.5 MG/0.5ML ~~LOC~~ SOAJ: 2.5000 mg | SUBCUTANEOUS | 0 refills | Status: DC

## 2022-08-08 MED ORDER — PRALUENT 150 MG/ML ~~LOC~~ SOAJ: 150.0000 mg | SUBCUTANEOUS | 3 refills | Status: DC

## 2022-08-13 ENCOUNTER — Telehealth: Payer: Self-pay | Admitting: Family

## 2022-08-13 NOTE — Telephone Encounter (Signed)
[place] with [facility] called to advise that the ________ was not on the formulary list and he was calling in to see if an alternative Repatha Push injection 420-3.5 mg can be called in. It can also be called in [phone]

## 2022-08-14 ENCOUNTER — Other Ambulatory Visit: Payer: Self-pay | Admitting: Family

## 2022-08-14 MED ORDER — REPATHA SURECLICK 140 MG/ML ~~LOC~~ SOAJ: 140.0000 mg | SUBCUTANEOUS | 2 refills | Status: DC

## 2022-09-05 ENCOUNTER — Other Ambulatory Visit: Payer: Self-pay | Admitting: Family

## 2022-09-06 ENCOUNTER — Encounter: Payer: Self-pay | Admitting: Family

## 2022-09-06 ENCOUNTER — Ambulatory Visit: Payer: BC Managed Care – PPO | Admitting: Family

## 2022-09-06 VITALS — BP 136/82 | HR 78 | Ht 78.0 in | Wt 229.0 lb

## 2022-09-06 DIAGNOSIS — R109 Unspecified abdominal pain: Secondary | ICD-10-CM | POA: Diagnosis not present

## 2022-09-06 DIAGNOSIS — R1031 Right lower quadrant pain: Secondary | ICD-10-CM

## 2022-09-06 DIAGNOSIS — Z7985 Long-term (current) use of injectable non-insulin antidiabetic drugs: Secondary | ICD-10-CM

## 2022-09-06 DIAGNOSIS — E119 Type 2 diabetes mellitus without complications: Secondary | ICD-10-CM

## 2022-09-06 LAB — COMPREHENSIVE METABOLIC PANEL
ALT: 9 U/L (ref 0–53)
AST: 17 U/L (ref 0–37)
Albumin: 4.5 g/dL (ref 3.5–5.2)
Alkaline Phosphatase: 85 U/L (ref 39–117)
BUN: 13 mg/dL (ref 6–23)
CO2: 29 mEq/L (ref 19–32)
Calcium: 9.7 mg/dL (ref 8.4–10.5)
Chloride: 104 mEq/L (ref 96–112)
Creatinine, Ser: 1.15 mg/dL (ref 0.40–1.50)
GFR: 74.64 mL/min (ref >60.00)
Glucose, Bld: 112 mg/dL — ABNORMAL HIGH (ref 70–99)
Potassium: 4.1 mEq/L (ref 3.5–5.1)
Sodium: 140 mEq/L (ref 135–145)
Total Bilirubin: 0.7 mg/dL (ref 0.2–1.2)
Total Protein: 7.2 g/dL (ref 6.0–8.3)

## 2022-09-06 LAB — CBC WITH DIFFERENTIAL/PLATELET
Basophils Absolute: 0 10*3/uL (ref 0.0–0.1)
Basophils Relative: 0.5 % (ref 0.0–3.0)
Eosinophils Absolute: 0.2 10*3/uL (ref 0.0–0.7)
Eosinophils Relative: 4.8 % (ref 0.0–5.0)
HCT: 43.5 % (ref 39.0–52.0)
Hemoglobin: 14.2 g/dL (ref 13.0–17.0)
Lymphocytes Relative: 29.9 % (ref 12.0–46.0)
Lymphs Abs: 1.3 10*3/uL (ref 0.7–4.0)
MCHC: 32.6 g/dL (ref 30.0–36.0)
MCV: 84.3 fl (ref 78.0–100.0)
Monocytes Absolute: 0.5 10*3/uL (ref 0.1–1.0)
Monocytes Relative: 10.7 % (ref 3.0–12.0)
Neutro Abs: 2.4 10*3/uL (ref 1.4–7.7)
Neutrophils Relative %: 54.1 % (ref 43.0–77.0)
Platelets: 351 10*3/uL (ref 150.0–400.0)
RBC: 5.16 Mil/uL (ref 4.22–5.81)
RDW: 12.6 % (ref 11.5–15.5)
WBC: 4.4 10*3/uL (ref 4.0–10.5)

## 2022-09-06 LAB — URINALYSIS
Bilirubin Urine: NEGATIVE
Ketones, ur: NEGATIVE
Leukocytes,Ua: NEGATIVE
Nitrite: NEGATIVE
Specific Gravity, Urine: 1.025 (ref 1.000–1.030)
Total Protein, Urine: NEGATIVE
Urine Glucose: NEGATIVE
Urobilinogen, UA: 0.2 (ref 0.0–1.0)
pH: 6 (ref 5.0–8.0)

## 2022-09-06 MED ORDER — TIRZEPATIDE 2.5 MG/0.5ML ~~LOC~~ SOAJ: 2.5000 mg | SUBCUTANEOUS | 3 refills | Status: DC

## 2022-09-06 MED ORDER — METHOCARBAMOL 500 MG PO TABS: 500.0000 mg | ORAL_TABLET | Freq: Three times a day (TID) | ORAL | 0 refills | Status: DC | PRN

## 2022-09-06 NOTE — Progress Notes (Signed)
Francisco Lawson is a 52 y.o. male with the following history as recorded in [employer]:  Patient Active Problem List   Diagnosis Date Noted   Left foot pain 08/11/2012   Right knee pain 08/11/2012    Current Outpatient Medications  Medication Sig Dispense Refill   cetirizine (ZYRTEC) 10 MG tablet Take 10 mg by mouth daily as needed for allergies or rhinitis.     Evolocumab (REPATHA SURECLICK) 140 MG/ML SOAJ Inject 140 mg into the skin every 14 (fourteen) days. 2 mL 2   hydrocortisone cream 1 % Apply 1 application topically daily as needed for itching.     methocarbamol (ROBAXIN) 500 MG tablet Take 1 tablet (500 mg total) by mouth every 8 (eight) hours as needed for muscle spasms. 30 tablet 0   tirzepatide (MOUNJARO) 2.5 MG/0.5ML Pen Inject 2.5 mg into the skin once a week. 2 mL 3   No current facility-administered medications for this visit.    Allergies: Aspirin, Excedrin migraine [aspirin-acetaminophen-caffeine], Nsaids, Statins, and Tolmetin  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    Past Surgical History:  Procedure Laterality Date   Biapical lung resection  2010   CHEST TUBE INSERTION     KNEE ARTHROSCOPY Right    LYMPH GLAND EXCISION     SPINE SURGERY      Family History  Problem Relation Age of Onset   Heart disease Father    Early death Father    Hypertension Father    Hyperlipidemia Father    Heart attack Father    Sudden death Father    Heart disease Mother    Hypertension Mother    Cancer Mother    Arthritis Mother     Social History   Tobacco Use   Smoking status: Never   Smokeless tobacco: Never  Substance Use Topics   Alcohol use: Yes    Comment: RARE    Subjective:   Low back pain x 1 month; "feels like more than a pulled muscle"-  symptoms on and off; no numbness/ tingling radiating into lower extremities; pain more noticeable with certain movements; concerned that feels persistent "dull pressure"; no nausea, vomiting, no changes in bowel movements or urinary symptoms;  Per patient, symptoms were present prior to starting Mounjaro;    Objective:  Vitals:   09/06/22 0851  BP: 136/82  Pulse: 78  SpO2: 96%  Weight: 229 lb (103.9 kg)  Height: 6\' 6"  (1.981 m)    General: Well developed, well nourished, in no acute distress  Skin : Warm and dry.  Head: Normocephalic and atraumatic  Lungs: Respirations unlabored;  Musculoskeletal: No deformities; no active joint inflammation  Extremities: No edema, cyanosis, clubbing  Vessels: Symmetric bilaterally  Neurologic: Alert and oriented; speech intact; face symmetrical; moves all extremities well; CNII-XII intact without focal deficit   Assessment:  1. Flank pain   2. Right lower quadrant abdominal pain   3. Type 2 diabetes mellitus without complication, without long-term current use of insulin (HCC)   4. Long-term current use of injectable noninsulin antidiabetic medication     Plan:  ? Muscular source vs kidney stone; per patient, symptoms were present prior to starting [facility]; will update labs, U/A and renal stone CT; can try  Robaxin 500 mg at bedtime prn; follow up to be determined;  Patient notes his fasting blood sugars are averaging 108 and he is concerned about losing more weight; will plan to continue Mounjaro at current dosage;   No follow-ups on file.  Orders Placed This Encounter  Procedures   CT RENAL STONE STUDY    Standing Status:   Future    Standing Expiration Date:   09/06/2023    Order Specific Question:   Preferred imaging location?    Answer:   [facility]   CBC with Differential/Platelet   Comp Met (CMET)   Urinalysis    Requested Prescriptions   Signed Prescriptions Disp Refills   tirzepatide (MOUNJARO) 2.5 MG/0.5ML Pen 2 mL 3     Sig: Inject 2.5 mg into the skin once a week.   methocarbamol (ROBAXIN) 500 MG tablet 30 tablet 0    Sig: Take 1 tablet (500 mg total) by mouth every 8 (eight) hours as needed for muscle spasms.

## 2022-09-07 ENCOUNTER — Ambulatory Visit (HOSPITAL_BASED_OUTPATIENT_CLINIC_OR_DEPARTMENT_OTHER)
Admission: RE | Admit: 2022-09-07 | Discharge: 2022-09-07 | Disposition: A | Discharge status: Home / Self Care | Payer: BC Managed Care – PPO | Source: Ambulatory Visit | Attending: Family | Admitting: Family

## 2022-09-07 ENCOUNTER — Other Ambulatory Visit: Payer: Self-pay | Admitting: Family

## 2022-09-07 DIAGNOSIS — K429 Umbilical hernia without obstruction or gangrene: Secondary | ICD-10-CM | POA: Diagnosis not present

## 2022-09-07 DIAGNOSIS — R1031 Right lower quadrant pain: Secondary | ICD-10-CM | POA: Diagnosis not present

## 2022-09-07 DIAGNOSIS — N400 Enlarged prostate without lower urinary tract symptoms: Secondary | ICD-10-CM | POA: Diagnosis not present

## 2022-09-07 DIAGNOSIS — R9389 Abnormal findings on diagnostic imaging of other specified body structures: Secondary | ICD-10-CM

## 2022-09-07 DIAGNOSIS — R109 Unspecified abdominal pain: Secondary | ICD-10-CM | POA: Diagnosis not present

## 2022-09-15 ENCOUNTER — Ambulatory Visit (HOSPITAL_BASED_OUTPATIENT_CLINIC_OR_DEPARTMENT_OTHER)
Admission: RE | Admit: 2022-09-15 | Discharge: 2022-09-15 | Disposition: A | Discharge status: Home / Self Care | Payer: BC Managed Care – PPO | Source: Ambulatory Visit | Attending: Family | Admitting: Family

## 2022-09-15 DIAGNOSIS — I7121 Aneurysm of the ascending aorta, without rupture: Secondary | ICD-10-CM | POA: Diagnosis not present

## 2022-09-15 DIAGNOSIS — I700 Atherosclerosis of aorta: Secondary | ICD-10-CM | POA: Diagnosis not present

## 2022-09-15 DIAGNOSIS — J479 Bronchiectasis, uncomplicated: Secondary | ICD-10-CM | POA: Diagnosis not present

## 2022-09-15 DIAGNOSIS — K449 Diaphragmatic hernia without obstruction or gangrene: Secondary | ICD-10-CM | POA: Diagnosis not present

## 2022-09-15 DIAGNOSIS — R9389 Abnormal findings on diagnostic imaging of other specified body structures: Secondary | ICD-10-CM | POA: Diagnosis not present

## 2022-09-20 ENCOUNTER — Telehealth: Payer: Self-pay | Admitting: Family

## 2022-09-20 NOTE — Telephone Encounter (Signed)
Pt called stating that he hadn't heard anything regarding his CT from last Sat (8.10.24) and wanted to follow up on its status. After reviewing chart, advised that it looked like the radiologist had not read the images yet and that was the reason for the delay. Advised a note would be sent back to reach out to them to see when those would be available. Pt would like a call with any info when possible.

## 2022-09-20 NOTE — Telephone Encounter (Signed)
Spoke with pt, pt is aware and expressed understanding. Pt would like PCP to be aware he is no longer taking the muscle relaxer as pt feel as if it is not helping the pain.

## 2022-09-24 ENCOUNTER — Other Ambulatory Visit: Payer: Self-pay | Admitting: Family

## 2022-09-24 DIAGNOSIS — A310 Pulmonary mycobacterial infection: Secondary | ICD-10-CM

## 2022-09-24 DIAGNOSIS — R9389 Abnormal findings on diagnostic imaging of other specified body structures: Secondary | ICD-10-CM

## 2022-10-05 ENCOUNTER — Ambulatory Visit: Payer: BC Managed Care – PPO | Admitting: Family

## 2022-10-05 ENCOUNTER — Encounter: Payer: Self-pay | Admitting: Family

## 2022-10-05 VITALS — BP 140/84 | HR 93 | Resp 19 | Ht 78.0 in | Wt 226.8 lb

## 2022-10-05 DIAGNOSIS — A310 Pulmonary mycobacterial infection: Secondary | ICD-10-CM

## 2022-10-05 MED ORDER — LEVOFLOXACIN 500 MG PO TABS: 500.0000 mg | ORAL_TABLET | Freq: Every day | ORAL | 0 refills | Status: AC

## 2022-10-05 NOTE — Patient Instructions (Signed)
Mycobacterium Avium Complex Mycobacterium avium complex (MAC) is an infection caused by two similar and very common types of bacteria. MAC causes two types of infection: Local infection. This is limited to one area. If you have a normal disease-fighting system (immune system), you are more likely to get this type of infection. Disseminated infection. This is an infection that affects multiple parts of the body. It is more serious than a local infection. You are more likely to get this infection if you have a weak immune system. MAC infections most often affect the lungs. MAC is not contagious. This means that it does not spread from person to person. What are the causes? This condition is caused by two kinds of bacteria, Mycobacterium avium and Mycobacterium intracellulare. These bacteria are often found in drinking water, hot tubs, swimming pools, house dust, and animals. You may become infected from: Breathing in water particles or dust particles that contain the bacteria (are contaminated). Eating or drinking something that is contaminated. Drinking milk. MAC bacteria can grow in pasteurized milk. What increases the risk? The following factors may make you more likely to develop this condition: Having HIV or AIDS. Being a child. Being an older woman. Smoking cigarettes. Having long-term (chronic) lung diseases, such as: Tuberculosis. Chronic obstructive pulmonary disease (COPD). Lung cancer. Cystic fibrosis. What are the signs or symptoms? Symptoms vary depending on the type of MAC infection you have. Symptoms of a local infection Lymph nodes that are bigger than usual (enlarged). Enlarged lymph nodes may be: On one side of the neck. Under the jaw. Around the ear. Feeling tired (fatigue). Shortness of breath. A chronic cough that produces lots of mucus. Abnormal sounds when breathing. A health care provider may hear this when listening to your lungs. Sores (ulcers) or lumps  (abscesses) on the skin. Symptoms of a disseminated infection Fever. Night sweats. Weight loss. Loss of appetite. Fatigue. Diarrhea. Abdominal pain. How is this diagnosed? This condition may be diagnosed based on: Your symptoms. A physical exam. Your medical history. You may also have tests, including: Chest X-ray or CT scan. Blood tests. Test of mucus from your lungs (sputum) or other body fluids to see whether bacteria will grow (culture). Removal of a piece of body tissue to be checked under a microscope (biopsy). How is this treated? Treatment for this condition depends on the type of infection. Treatment may include: Taking antibiotic medicines. You may have to take antibiotics until MAC bacteria have stopped growing in cultures. Surgery to remove infected lymph nodes. After the lymph nodes are removed, antibiotics are usually not needed. In some people with a disseminated infection, treatment with antibiotics may continue for several years. Follow these instructions at home: Medicines  Take over-the-counter and prescription medicines only as told by your health care provider. Take your antibiotic medicine as told by your health care provider. Do not stop taking the antibiotic even if you start to feel better. Eating and drinking  Eat a healthy, well-balanced diet. Talk with your health care provider or a dietitian about what food choices are best for you. Drink enough fluid to keep your urine pale yellow. General instructions Seek medical care for any underlying health conditions you may have. Follow your health care provider's instructions about how to manage those conditions. Do not use any products that contain nicotine or tobacco. These products include cigarettes, chewing tobacco, and vaping devices, such as e-cigarettes. If you need help quitting, ask your health care provider. Keep all follow-up visits. This is  important. How is this prevented?  Stay up to date on  your vaccines. Take all of your medicines as told by your health care provider, especially if you have problems with your immune system. Avoid drinking water that may not be clean. Lakes, rivers, and other open water sources can contain germs that cause infections. Wash your hands often with soap and water. If soap and water are not available, use hand sanitizer. Practice safe food preparation, especially if your immune system is weak because of an underlying illness. Store foods at safe temperatures. Avoid eating raw or undercooked meats, poultry, fish, shellfish, and eggs. Wash and peel fruits and vegetables before eating or cooking them. Use separate food preparation surfaces and storage spaces for raw meat and for fruits and vegetables. Wash your hands, food preparation surfaces, and utensils thoroughly before and after you handle raw foods. Contact a health care provider if you: Have chills or a fever. Have a cough that does not go away. Have loss of appetite or weight loss. Feel very tired. Get help right away if you: Have a fever for more than 2-3 days. Cough up blood. Have trouble breathing. Have chest pain. These symptoms may represent a serious problem that is an emergency. Do not wait to see if the symptoms will go away. Get medical help right away. Call your local emergency services (911 in the [place] Do not drive yourself to the hospital. Summary Mycobacterium avium complex (MAC) is an infection that is caused by two similar and very common types of bacteria. If you have a normal immune system, you are more likely to get a local infection. If you have a weak immune system, you are more likely to get a disseminated infection. Treatment depends on the type of infection and may include antibiotic medicines or surgery to remove infected lymph nodes. This information is not intended to replace advice given to you by your health care provider. Make sure you discuss any questions you  have with your health care provider. Document Revised: 04/28/2020 Document Reviewed: 04/28/2020 Elsevier Patient Education  2024 [employer].

## 2022-10-05 NOTE — Progress Notes (Signed)
Francisco Lawson is a 52 y.o. male with the following history as recorded in EpicCare:  Patient Active Problem List   Diagnosis Date Noted   Left foot pain 08/11/2012   Right knee pain 08/11/2012    Current Outpatient Medications  Medication Sig Dispense Refill   cetirizine (ZYRTEC) 10 MG tablet Take 10 mg by mouth daily as needed for allergies or rhinitis.     Evolocumab (REPATHA SURECLICK) 140 MG/ML SOAJ Inject 140 mg into the skin every 14 (fourteen) days. 2 mL 2   hydrocortisone cream 1 % Apply 1 application topically daily as needed for itching.     levofloxacin (LEVAQUIN) 500 MG tablet Take 1 tablet (500 mg total) by mouth daily for 7 days. 7 tablet 0   methocarbamol (ROBAXIN) 500 MG tablet Take 1 tablet (500 mg total) by mouth every 8 (eight) hours as needed for muscle spasms. 30 tablet 0   tirzepatide (MOUNJARO) 2.5 MG/0.5ML Pen Inject 2.5 mg into the skin once a week. 2 mL 3   No current facility-administered medications for this visit.    Allergies: Aspirin, Excedrin migraine [aspirin-acetaminophen-caffeine], Nsaids, Statins, and Tolmetin  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    Past Surgical History:  Procedure Laterality Date   Biapical lung resection  2010   CHEST TUBE INSERTION     KNEE ARTHROSCOPY Right    LYMPH GLAND EXCISION     SPINE SURGERY      Family History  Problem Relation Age of Onset   Heart disease Father    Early death Father    Hypertension Father    Hyperlipidemia Father    Heart attack Father    Sudden death Father    Heart disease Mother    Hypertension Mother    Cancer Mother    Arthritis Mother     Social History   Tobacco Use   Smoking status: Never   Smokeless tobacco: Never  Substance Use Topics   Alcohol use:  Yes    Comment: RARE    Subjective:   Follow up on recent CT results- suspect MAI infection/ has been referred to pulmonology; concerned that he has not been contacted to schedule and continuing to experience recurrent right sided flank pain/ breathing issues;   Objective:  Vitals:   10/05/22 0846  BP: (!) 140/84  Pulse: 93  Resp: 19  SpO2: 97%  Weight: 226 lb 12.8 oz (102.9 kg)  Height: 6\' 6"  (1.981 m)    General: Well developed, well nourished, in no acute distress  Skin : Warm and dry.  Head: Normocephalic and atraumatic  Lungs: Respirations unlabored;  Neurologic: Alert and oriented; speech intact; face symmetrical; moves all extremities well; CNII-XII intact without focal deficit   Assessment:  1. MAI (mycobacterium avium-intracellulare) infection (HCC)     Plan:  Will go ahead and start Levaquin 500 mg every day x 7 days; patient has not been contacted to schedule with pulmonology- will reach out to our referral team to determine options to get him seen as soon as possible;   Plan to follow up for diabetes re-check in 3 months.   No follow-ups on file.  No orders of the defined types were placed in this encounter.  Requested Prescriptions   Signed Prescriptions Disp Refills   levofloxacin (LEVAQUIN) 500 MG tablet 7 tablet 0    Sig: Take 1 tablet (500 mg total) by mouth daily for 7 days.

## 2022-10-15 DIAGNOSIS — Z872 Personal history of diseases of the skin and subcutaneous tissue: Secondary | ICD-10-CM | POA: Diagnosis not present

## 2022-10-15 DIAGNOSIS — J479 Bronchiectasis, uncomplicated: Secondary | ICD-10-CM | POA: Diagnosis not present

## 2022-10-30 ENCOUNTER — Other Ambulatory Visit: Payer: Self-pay | Admitting: Family

## 2022-11-06 ENCOUNTER — Ambulatory Visit: Payer: BC Managed Care – PPO | Admitting: Family

## 2022-11-06 ENCOUNTER — Other Ambulatory Visit (HOSPITAL_BASED_OUTPATIENT_CLINIC_OR_DEPARTMENT_OTHER): Payer: Self-pay

## 2022-11-06 ENCOUNTER — Encounter: Payer: Self-pay | Admitting: Family

## 2022-11-06 VITALS — BP 122/84 | HR 75 | Temp 97.9°F | Resp 18 | Ht 78.0 in | Wt 228.8 lb

## 2022-11-06 DIAGNOSIS — L03115 Cellulitis of right lower limb: Secondary | ICD-10-CM

## 2022-11-06 DIAGNOSIS — J479 Bronchiectasis, uncomplicated: Secondary | ICD-10-CM | POA: Diagnosis not present

## 2022-11-06 MED ORDER — DOXYCYCLINE HYCLATE 100 MG PO TABS
100.0000 mg | ORAL_TABLET | Freq: Two times a day (BID) | ORAL | 0 refills | Status: DC
  Filled 2022-11-06: qty 14, 7d supply, fill #0

## 2022-11-06 NOTE — Progress Notes (Signed)
Francisco Lawson is a 52 y.o. male with the following history as recorded in EpicCare:  Patient Active Problem List   Diagnosis Date Noted   Left foot pain 08/11/2012   Right knee pain 08/11/2012    Current Outpatient Medications  Medication Sig Dispense Refill   cetirizine (ZYRTEC) 10 MG tablet Take 10 mg by mouth daily as needed for allergies or rhinitis.     doxycycline (VIBRA-TABS) 100 MG tablet Take 1 tablet (100 mg total) by mouth 2 (two) times daily. 14 tablet 0   hydrocortisone cream 1 % Apply 1 application topically daily as needed for itching.     methocarbamol (ROBAXIN) 500 MG tablet Take 1 tablet (500 mg total) by mouth every 8 (eight) hours as needed for muscle spasms. 30 tablet 0   REPATHA SURECLICK 140 MG/ML SOAJ INJECT 1 PEN SUBCUTANEOUSLY EVERY 14 DAYS 2 mL 0   tirzepatide (MOUNJARO) 2.5 MG/0.5ML Pen Inject 2.5 mg into the skin once a week. 2 mL 3   No current facility-administered medications for this visit.    Allergies: Aspirin, Excedrin migraine [aspirin-acetaminophen-caffeine], Nsaids, Statins, and Tolmetin  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    Past Surgical History:  Procedure Laterality Date   Biapical lung resection  2010   CHEST TUBE INSERTION     KNEE ARTHROSCOPY Right    LYMPH GLAND EXCISION     SPINE SURGERY      Family History  Problem Relation Age of Onset   Heart disease Father    Early death Father    Hypertension Father    Hyperlipidemia Father    Heart attack Father    Sudden death Father    Heart disease Mother    Hypertension Mother    Cancer Mother    Arthritis Mother     Social History   Tobacco Use   Smoking status: Never   Smokeless tobacco: Never  Substance Use Topics   Alcohol use: Yes    Comment: RARE     Subjective:   Concern for bilateral leg pain/ "spots on leg" that started last week with blister; was moving boxes prior to onset of symptoms; has been able to go to work/ eating normally; Mostly concerned about "spot" on right leg- area did open up and drain yesterday; no known tick bite;   Objective:  Vitals:   11/06/22 0852  BP: 122/84  Pulse: 75  Resp: 18  Temp: 97.9 F (36.6 C)  TempSrc: Oral  SpO2: 97%  Weight: 228 lb 12.8 oz (103.8 kg)  Height: 6\' 6"  (1.981 m)    General: Well developed, well nourished, in no acute distress  Skin : Warm and dry. Scabbed lesion on lateral right leg with surrounding erythema- no warmth or streaking noted;  Head: Normocephalic and atraumatic  Eyes: Sclera and conjunctiva clear; pupils round and reactive to light; extraocular movements intact  Ears: External normal; canals clear; tympanic membranes normal  Oropharynx: Pink, supple. No suspicious lesions  Neck: Supple without thyromegaly, adenopathy  Lungs: Respirations unlabored;  Neurologic: Alert and oriented; speech intact; face symmetrical; moves all extremities well; CNII-XII intact without focal deficit   Assessment:  1. Cellulitis of right lower extremity     Plan:  Suspect  insect bite- ? Spider bite; area did open up and drain yesterday; will treat with Doxycycline 100 mg bid x 7 days; encouraged to continue to apply warm moist compresses and monitor the redness around bite- follow up if he notices area of redness spreading rather than resolving;   No follow-ups on file.  No orders of the defined types were placed in this encounter.   Requested Prescriptions   Signed Prescriptions Disp Refills   doxycycline (VIBRA-TABS) 100 MG tablet 14 tablet 0    Sig: Take 1 tablet (100 mg total) by mouth 2 (two) times daily.

## 2022-11-19 ENCOUNTER — Institutional Professional Consult (permissible substitution): Payer: BC Managed Care – PPO | Admitting: Pulmonary Disease

## 2022-11-22 DIAGNOSIS — J479 Bronchiectasis, uncomplicated: Secondary | ICD-10-CM | POA: Diagnosis not present

## 2022-11-26 ENCOUNTER — Other Ambulatory Visit: Payer: Self-pay | Admitting: Family

## 2022-11-28 DIAGNOSIS — J479 Bronchiectasis, uncomplicated: Secondary | ICD-10-CM | POA: Diagnosis not present

## 2022-12-26 ENCOUNTER — Other Ambulatory Visit (INDEPENDENT_AMBULATORY_CARE_PROVIDER_SITE_OTHER): Payer: BC Managed Care – PPO

## 2022-12-26 ENCOUNTER — Ambulatory Visit: Payer: BC Managed Care – PPO

## 2022-12-26 DIAGNOSIS — Z23 Encounter for immunization: Secondary | ICD-10-CM

## 2022-12-26 NOTE — Progress Notes (Signed)
Patient presents for tdap vaccine. Vaccine given and patient tolerated vaccine well.

## 2023-01-08 ENCOUNTER — Encounter: Payer: Self-pay | Admitting: Family

## 2023-01-08 ENCOUNTER — Ambulatory Visit: Payer: BC Managed Care – PPO | Admitting: Family

## 2023-01-08 VITALS — BP 124/70 | HR 85 | Resp 18 | Ht 78.0 in | Wt 232.4 lb

## 2023-01-08 DIAGNOSIS — E119 Type 2 diabetes mellitus without complications: Secondary | ICD-10-CM | POA: Diagnosis not present

## 2023-01-08 DIAGNOSIS — Z7985 Long-term (current) use of injectable non-insulin antidiabetic drugs: Secondary | ICD-10-CM

## 2023-01-08 LAB — CBC WITH DIFFERENTIAL/PLATELET
Basophils Absolute: 0 10*3/uL (ref 0.0–0.1)
Basophils Relative: 0.9 % (ref 0.0–3.0)
Eosinophils Absolute: 0.3 10*3/uL (ref 0.0–0.7)
Eosinophils Relative: 7.3 % — ABNORMAL HIGH (ref 0.0–5.0)
HCT: 44.6 % (ref 39.0–52.0)
Hemoglobin: 14.7 g/dL (ref 13.0–17.0)
Lymphocytes Relative: 41.8 % (ref 12.0–46.0)
Lymphs Abs: 1.6 10*3/uL (ref 0.7–4.0)
MCHC: 33.1 g/dL (ref 30.0–36.0)
MCV: 84.1 fL (ref 78.0–100.0)
Monocytes Absolute: 0.4 10*3/uL (ref 0.1–1.0)
Monocytes Relative: 10.6 % (ref 3.0–12.0)
Neutro Abs: 1.5 10*3/uL (ref 1.4–7.7)
Neutrophils Relative %: 39.4 % — ABNORMAL LOW (ref 43.0–77.0)
Platelets: 326 10*3/uL (ref 150.0–400.0)
RBC: 5.3 Mil/uL (ref 4.22–5.81)
RDW: 12.4 % (ref 11.5–15.5)
WBC: 3.9 10*3/uL — ABNORMAL LOW (ref 4.0–10.5)

## 2023-01-08 LAB — COMPREHENSIVE METABOLIC PANEL
ALT: 13 U/L (ref 0–53)
AST: 18 U/L (ref 0–37)
Albumin: 4.3 g/dL (ref 3.5–5.2)
Alkaline Phosphatase: 87 U/L (ref 39–117)
BUN: 10 mg/dL (ref 6–23)
CO2: 30 meq/L (ref 19–32)
Calcium: 9.5 mg/dL (ref 8.4–10.5)
Chloride: 106 meq/L (ref 96–112)
Creatinine, Ser: 1.12 mg/dL (ref 0.40–1.50)
GFR: 76.87 mL/min (ref >60.00)
Glucose, Bld: 147 mg/dL — ABNORMAL HIGH (ref 70–99)
Potassium: 4 meq/L (ref 3.5–5.1)
Sodium: 142 meq/L (ref 135–145)
Total Bilirubin: 0.6 mg/dL (ref 0.2–1.2)
Total Protein: 7 g/dL (ref 6.0–8.3)

## 2023-01-08 LAB — LIPID PANEL
Cholesterol: 161 mg/dL (ref 0–200)
HDL: 42.4 mg/dL (ref >39.00)
LDL Cholesterol: 103 mg/dL — ABNORMAL HIGH (ref 0–99)
NonHDL: 118.74
Total CHOL/HDL Ratio: 4
Triglycerides: 77 mg/dL (ref 0.0–149.0)
VLDL: 15.4 mg/dL (ref 0.0–40.0)

## 2023-01-08 LAB — HEMOGLOBIN A1C: Hgb A1c MFr Bld: 8.7 % — ABNORMAL HIGH (ref 4.6–6.5)

## 2023-01-08 NOTE — Patient Instructions (Signed)
I would prefer that you speak to your pulmonologist about getting a handicapped placard. I do not feel comfortable completing that for you.

## 2023-01-08 NOTE — Progress Notes (Signed)
Francisco Lawson is a 52 y.o. male with the following history as recorded in EpicCare:  Patient Active Problem List   Diagnosis Date Noted   Left foot pain 08/11/2012   Right knee pain 08/11/2012    Current Outpatient Medications  Medication Sig Dispense Refill   cetirizine (ZYRTEC) 10 MG tablet Take 10 mg by mouth daily as needed for allergies or rhinitis.     Evolocumab (REPATHA SURECLICK) 140 MG/ML SOAJ Inject 140 mg into the skin every 14 (fourteen) days. 2 mL 5   hydrocortisone cream 1 % Apply 1 application topically daily as needed for itching.     methocarbamol (ROBAXIN) 500 MG tablet Take 1 tablet (500 mg total) by mouth every 8 (eight) hours as needed for muscle spasms. 30 tablet 0   tirzepatide (MOUNJARO) 2.5 MG/0.5ML Pen Inject 2.5 mg into the skin once a week. 2 mL 3   No current facility-administered medications for this visit.    Allergies: Aspirin, Excedrin migraine [aspirin-acetaminophen-caffeine], Nsaids, Statins, and Tolmetin  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    Past Surgical History:  Procedure Laterality Date   Biapical lung resection  2010   CHEST TUBE INSERTION     KNEE ARTHROSCOPY Right    LYMPH GLAND EXCISION     SPINE SURGERY      Family History  Problem Relation Age of Onset   Heart disease Father    Early death Father    Hypertension Father    Hyperlipidemia Father    Heart attack Father    Sudden death Father    Heart disease Mother    Hypertension Mother    Cancer Mother    Arthritis Mother     Social History   Tobacco Use   Smoking status: Never   Smokeless tobacco: Never  Substance Use Topics   Alcohol use: Yes    Comment: RARE    Subjective:   3 month follow up on Type 2 Diabetes; no acute concerns today;  tolerating Mounjaro and Repatha well;  4th child is due next week- very excited about upcoming birth;    Objective:  Vitals:   01/08/23 0832  BP: 124/70  Pulse: 85  Resp: 18  SpO2: 94%  Weight: 232 lb 6.4 oz (105.4 kg)  Height: 6\' 6"  (1.981 m)    General: Well developed, well nourished, in no acute distress  Skin : Warm and dry.  Head: Normocephalic and atraumatic  Lungs: Respirations unlabored; clear to auscultation bilaterally without wheeze, rales, rhonchi  CVS exam: normal rate and regular rhythm.  Neurologic: Alert and oriented; speech intact; face symmetrical; moves all extremities well; CNII-XII intact without focal deficit   Assessment:  1. Type 2 diabetes mellitus without complication, without long-term current use of insulin (HCC)   2. Diabetes mellitus treated with injections of non-insulin medication (HCC)     Plan:  Patient doing well- will update labs today; to consider increased dosage of Mounjaro if Hgba1c is not well controlled; follow up in 6 months, sooner prn.   Return in about 6 months (around 07/09/2023).  Orders Placed This Encounter  Procedures   CBC with Differential/Platelet   Comp Met (CMET)   Hemoglobin A1c   Lipid panel  Requested Prescriptions    No prescriptions requested or ordered in this encounter

## 2023-01-09 ENCOUNTER — Other Ambulatory Visit: Payer: Self-pay | Admitting: Family

## 2023-01-09 ENCOUNTER — Telehealth: Payer: Self-pay | Admitting: Family

## 2023-01-09 MED ORDER — TIRZEPATIDE 5 MG/0.5ML ~~LOC~~ SOAJ: 5.0000 mg | SUBCUTANEOUS | 0 refills | Status: DC

## 2023-01-09 NOTE — Telephone Encounter (Signed)
Patient called and states missed a call from provider he states that if it was in regards to increasing his insulin he is ok with it.

## 2023-01-09 NOTE — Telephone Encounter (Signed)
My chart message sent to pt.

## 2023-01-16 ENCOUNTER — Telehealth: Payer: Self-pay | Admitting: Family

## 2023-01-16 NOTE — Telephone Encounter (Signed)
Pt came in office stating is wanting RSV shot - if ok to put in order so pt can be schedule with Nurse for this shot. Please contact pt at [phone]. Please advise.

## 2023-01-17 NOTE — Telephone Encounter (Signed)
Spoke with pt, pt is aware and expressed understanding.  

## 2023-01-18 ENCOUNTER — Other Ambulatory Visit (HOSPITAL_BASED_OUTPATIENT_CLINIC_OR_DEPARTMENT_OTHER): Payer: Self-pay

## 2023-01-18 MED ORDER — COVID-19 MRNA VAC-TRIS(PFIZER) 30 MCG/0.3ML IM SUSY
0.3000 mL | PREFILLED_SYRINGE | Freq: Once | INTRAMUSCULAR | 0 refills | Status: AC
  Filled 2023-01-18: qty 0.3, 1d supply, fill #0

## 2023-02-04 ENCOUNTER — Other Ambulatory Visit: Payer: Self-pay | Admitting: Family

## 2023-04-12 ENCOUNTER — Other Ambulatory Visit (HOSPITAL_BASED_OUTPATIENT_CLINIC_OR_DEPARTMENT_OTHER): Payer: Self-pay

## 2023-04-12 ENCOUNTER — Encounter: Payer: Self-pay | Admitting: Family

## 2023-04-12 ENCOUNTER — Ambulatory Visit: Admitting: Family

## 2023-04-12 VITALS — BP 138/82 | HR 94 | Ht 78.0 in | Wt 232.8 lb

## 2023-04-12 DIAGNOSIS — Z113 Encounter for screening for infections with a predominantly sexual mode of transmission: Secondary | ICD-10-CM | POA: Diagnosis not present

## 2023-04-12 MED ORDER — METRONIDAZOLE 500 MG PO TABS
500.0000 mg | ORAL_TABLET | Freq: Two times a day (BID) | ORAL | 0 refills | Status: AC
  Filled 2023-04-12: qty 14, 7d supply, fill #0

## 2023-04-12 NOTE — Progress Notes (Signed)
 Francisco Lawson is a 53 y.o. male with the following history as recorded in EpicCare:  Patient Active Problem List   Diagnosis Date Noted   Left foot pain 08/11/2012   Right knee pain 08/11/2012    Current Outpatient Medications  Medication Sig Dispense Refill   cetirizine (ZYRTEC) 10 MG tablet Take 10 mg by mouth daily as needed for allergies or rhinitis.     Evolocumab (REPATHA SURECLICK) 140 MG/ML SOAJ Inject 140 mg into the skin every 14 (fourteen) days. 2 mL 5   hydrocortisone cream 1 % Apply 1 application topically daily as needed for itching.     methocarbamol (ROBAXIN) 500 MG tablet Take 1 tablet (500 mg total) by mouth every 8 (eight) hours as needed for muscle spasms. 30 tablet 0   metroNIDAZOLE (FLAGYL) 500 MG tablet Take 1 tablet (500 mg total) by mouth 2 (two) times daily for 7 days. 14 tablet 0   tirzepatide (MOUNJARO) 5 MG/0.5ML Pen Inject 5 mg into the skin once a week. (Patient not taking: Reported on 04/12/2023) 6 mL 0   No current facility-administered medications for this visit.    Allergies: Aspirin, Excedrin migraine [aspirin-acetaminophen-caffeine], Nsaids, Statins, and Tolmetin  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    Past Surgical History:  Procedure Laterality Date   Biapical lung resection  2010   CHEST TUBE INSERTION     KNEE ARTHROSCOPY Right    LYMPH GLAND EXCISION     SPINE SURGERY      Family History  Problem Relation Age of Onset   Heart disease Father    Early death Father    Hypertension Father    Hyperlipidemia Father    Heart attack Father    Sudden death Father    Heart disease Mother    Hypertension Mother    Cancer Mother    Arthritis Mother     Social History   Tobacco Use   Smoking status: Never   Smokeless  tobacco: Never  Substance Use Topics   Alcohol use: Yes    Comment: RARE    Subjective:   Patient notes he was asked by his wife to come get an STD screen; however, he denies any signs/ symptoms; he notes he is understandably upset and concerned that his wife is not being truthful with him; he notes that he was treated for trichomonas last fall after she was diagnosed at a prenatal visit; he completed course of Flagyl;   Objective:  Vitals:   04/12/23 1024  BP: 138/82  Pulse: 94  SpO2: 97%  Weight: 232 lb 12.8 oz (105.6 kg)  Height: 6\' 6"  (1.981 m)    General: Well developed, well nourished, in no acute distress  Skin : Warm and dry.  Head: Normocephalic and atraumatic  Lungs: Respirations unlabored;  Neurologic: Alert and oriented; speech intact; face symmetrical; moves all extremities well; CNII-XII intact without focal deficit   Assessment:  1. Screen for STD (sexually transmitted disease)     Plan:  Will update testing today; will re-treat for Trichomonas since that was noted in the past; follow up to be determined;   No follow-ups on file.  Orders Placed This Encounter  Procedures   GC/Chlamydia Probe Amp  HIV antibody (with reflex)   RPR    Requested Prescriptions   Signed Prescriptions Disp Refills   metroNIDAZOLE (FLAGYL) 500 MG tablet 14 tablet 0    Sig: Take 1 tablet (500 mg total) by mouth 2 (two) times daily for 7 days.

## 2023-04-13 LAB — HIV ANTIBODY (ROUTINE TESTING W REFLEX): HIV 1&2 Ab, 4th Generation: NONREACTIVE

## 2023-04-13 LAB — RPR: RPR Ser Ql: NONREACTIVE

## 2023-04-15 ENCOUNTER — Encounter: Payer: Self-pay | Admitting: Family

## 2023-04-15 LAB — GC/CHLAMYDIA PROBE AMP
Chlamydia trachomatis, NAA: NEGATIVE
Neisseria Gonorrhoeae by PCR: NEGATIVE

## 2023-04-25 ENCOUNTER — Other Ambulatory Visit: Payer: Self-pay | Admitting: Family

## 2023-04-25 DIAGNOSIS — E119 Type 2 diabetes mellitus without complications: Secondary | ICD-10-CM

## 2023-04-30 ENCOUNTER — Other Ambulatory Visit: Payer: Self-pay | Admitting: Family

## 2023-04-30 MED ORDER — TIRZEPATIDE 5 MG/0.5ML ~~LOC~~ SOAJ: 5.0000 mg | SUBCUTANEOUS | 0 refills | Status: DC

## 2023-05-10 ENCOUNTER — Telehealth: Payer: Self-pay | Admitting: *Deleted

## 2023-05-10 NOTE — Telephone Encounter (Signed)
 Patient was identified as falling into the [place] Measure - Diabetes.   Patient was: Appointment scheduled for lab or office visit for A1c.   Lab appt scheduled for 05/13/23.

## 2023-05-13 ENCOUNTER — Encounter: Payer: Self-pay | Admitting: Family

## 2023-05-13 ENCOUNTER — Other Ambulatory Visit: Payer: Self-pay | Admitting: Family

## 2023-05-13 ENCOUNTER — Other Ambulatory Visit (INDEPENDENT_AMBULATORY_CARE_PROVIDER_SITE_OTHER)

## 2023-05-13 DIAGNOSIS — E119 Type 2 diabetes mellitus without complications: Secondary | ICD-10-CM | POA: Diagnosis not present

## 2023-05-13 LAB — HEMOGLOBIN A1C: Hgb A1c MFr Bld: 7.9 % — ABNORMAL HIGH (ref 4.6–6.5)

## 2023-05-13 LAB — COMPREHENSIVE METABOLIC PANEL WITH GFR
ALT: 13 U/L (ref 0–53)
AST: 19 U/L (ref 0–37)
Albumin: 4.4 g/dL (ref 3.5–5.2)
Alkaline Phosphatase: 77 U/L (ref 39–117)
BUN: 11 mg/dL (ref 6–23)
CO2: 30 meq/L (ref 19–32)
Calcium: 9.5 mg/dL (ref 8.4–10.5)
Chloride: 105 meq/L (ref 96–112)
Creatinine, Ser: 1.01 mg/dL (ref 0.40–1.50)
GFR: 86.81 mL/min (ref >60.00)
Glucose, Bld: 134 mg/dL — ABNORMAL HIGH (ref 70–99)
Potassium: 4.2 meq/L (ref 3.5–5.1)
Sodium: 143 meq/L (ref 135–145)
Total Bilirubin: 0.5 mg/dL (ref 0.2–1.2)
Total Protein: 7 g/dL (ref 6.0–8.3)

## 2023-05-13 MED ORDER — TIRZEPATIDE 7.5 MG/0.5ML ~~LOC~~ SOAJ: 7.5000 mg | SUBCUTANEOUS | 0 refills | Status: DC

## 2023-05-17 ENCOUNTER — Other Ambulatory Visit: Payer: Self-pay | Admitting: Family

## 2023-06-07 ENCOUNTER — Other Ambulatory Visit: Payer: Self-pay | Admitting: Family

## 2023-07-05 ENCOUNTER — Other Ambulatory Visit: Payer: Self-pay | Admitting: Family

## 2023-07-05 MED ORDER — MOUNJARO 7.5 MG/0.5ML ~~LOC~~ SOAJ: 7.5000 mg | SUBCUTANEOUS | 1 refills | Status: DC

## 2023-07-09 ENCOUNTER — Encounter: Payer: Self-pay | Admitting: Family

## 2023-07-11 ENCOUNTER — Ambulatory Visit: Payer: Self-pay | Admitting: Family

## 2023-07-11 ENCOUNTER — Ambulatory Visit (HOSPITAL_BASED_OUTPATIENT_CLINIC_OR_DEPARTMENT_OTHER)
Admission: RE | Admit: 2023-07-11 | Discharge: 2023-07-11 | Disposition: A | Discharge status: Home / Self Care | Source: Ambulatory Visit | Attending: Family | Admitting: Family

## 2023-07-11 ENCOUNTER — Ambulatory Visit: Admitting: Family

## 2023-07-11 ENCOUNTER — Encounter: Payer: Self-pay | Admitting: Family

## 2023-07-11 ENCOUNTER — Other Ambulatory Visit: Payer: Self-pay | Admitting: Family

## 2023-07-11 ENCOUNTER — Other Ambulatory Visit (HOSPITAL_BASED_OUTPATIENT_CLINIC_OR_DEPARTMENT_OTHER): Payer: Self-pay

## 2023-07-11 VITALS — BP 114/82 | HR 76 | Ht 78.0 in | Wt 228.4 lb

## 2023-07-11 DIAGNOSIS — R059 Cough, unspecified: Secondary | ICD-10-CM

## 2023-07-11 DIAGNOSIS — R062 Wheezing: Secondary | ICD-10-CM | POA: Diagnosis not present

## 2023-07-11 DIAGNOSIS — A310 Pulmonary mycobacterial infection: Secondary | ICD-10-CM | POA: Diagnosis not present

## 2023-07-11 DIAGNOSIS — E1169 Type 2 diabetes mellitus with other specified complication: Secondary | ICD-10-CM

## 2023-07-11 MED ORDER — DOXYCYCLINE HYCLATE 100 MG PO TABS
100.0000 mg | ORAL_TABLET | Freq: Two times a day (BID) | ORAL | 0 refills | Status: DC
  Filled 2023-07-11: qty 14, 7d supply, fill #0

## 2023-07-11 MED ORDER — PREDNISONE 20 MG PO TABS
20.0000 mg | ORAL_TABLET | Freq: Every day | ORAL | 0 refills | Status: DC
  Filled 2023-07-11: qty 5, 5d supply, fill #0

## 2023-07-11 NOTE — Patient Instructions (Addendum)
 Please schedule a follow up with your pulmonologist; you were due to see him in April.   Arlo Berber, MD  [address]  [address] [place], [place] [address]  Phone: [phone]  Fax: [phone]

## 2023-07-11 NOTE — Progress Notes (Signed)
 Francisco Lawson is a 53 y.o. male with the following history as recorded in [employer]:  Patient Active Problem List   Diagnosis Date Noted   Left foot pain 08/11/2012   Right knee pain 08/11/2012    Current Outpatient Medications  Medication Sig Dispense Refill   cetirizine (ZYRTEC) 10 MG tablet Take 10 mg by mouth daily as needed for allergies or rhinitis.     doxycycline  (VIBRA -TABS) 100 MG tablet Take 1 tablet (100 mg total) by mouth 2 (two) times daily. 14 tablet 0   Evolocumab  (REPATHA  SURECLICK) 140 MG/ML SOAJ INJECT 1  SUBCUTANEOUSLY EVERY TWO WEEKS 2 mL 3   hydrocortisone cream 1 % Apply 1 application topically daily as needed for itching.     methocarbamol  (ROBAXIN ) 500 MG tablet Take 1 tablet (500 mg total) by mouth every 8 (eight) hours as needed for muscle spasms. 30 tablet 0   predniSONE  (DELTASONE ) 20 MG tablet Take 1 tablet (20 mg total) by mouth daily with breakfast. 5 tablet 0   tirzepatide  (MOUNJARO ) 7.5 MG/0.5ML Pen Inject 7.5 mg into the skin once a week. 4 mL 1   No current facility-administered medications for this visit.    Allergies: Aspirin, Excedrin migraine [aspirin-acetaminophen -caffeine], Nsaids, Statins, and Tolmetin  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    Past Surgical History:  Procedure Laterality Date   Biapical lung resection  2010   CHEST TUBE INSERTION     KNEE ARTHROSCOPY Right    LYMPH GLAND EXCISION     SPINE SURGERY      Family History  Problem Relation Age of Onset   Heart disease Father    Early death Father    Hypertension Father    Hyperlipidemia Father    Heart attack Father    Sudden death Father    Heart disease Mother    Hypertension Mother    Cancer Mother    Arthritis Mother     Social History    Tobacco Use   Smoking status: Never   Smokeless tobacco: Never  Substance Use Topics   Alcohol use: Yes    Comment: RARE    Subjective:   1 week of cough/ congestion; does have history of chronic bronchiectasis- under care of pulmonology; no headache/ no chest pain; overdue to see pulmonologist;   Objective:  Vitals:   07/11/23 0819  BP: 114/82  Pulse: 76  SpO2: 97%  Weight: 228 lb 6.4 oz (103.6 kg)  Height: 6\' 6"  (1.981 m)    General: Well developed, well nourished, in no acute distress  Skin : Warm and dry.  Head: Normocephalic and atraumatic  Eyes: Sclera and conjunctiva clear; pupils round and reactive to light; extraocular movements intact  Ears: External normal; canals clear; tympanic membranes normal  Oropharynx: Pink, supple. No suspicious lesions  Neck: Supple without thyromegaly, adenopathy  Lungs: Respirations unlabored; coarse breath sounds in all 4 lobes;  CVS exam: normal rate and regular rhythm.  Neurologic: Alert and oriented; speech intact; face symmetrical; moves all extremities well; CNII-XII intact without focal deficit   Assessment:  1. Cough, unspecified type   2. MAI (mycobacterium avium-intracellulare) (HCC)     Plan:  Update CXR today; Rx for Doxycycline  and Prednisone ; encouraged to  follow up with his pulmonologist;   Plan to return in 1 month for labs to follow up on Type 2 Diabetes;   No follow-ups on file.  Orders Placed This Encounter  Procedures   DG Chest 2 View    Standing Status:   Future    Expiration Date:   07/10/2024    Reason for Exam (SYMPTOM  OR DIAGNOSIS REQUIRED):   cough/ wheezing    Preferred imaging location?:   [facility]    Requested Prescriptions   Signed Prescriptions Disp Refills   doxycycline  (VIBRA -TABS) 100 MG tablet 14 tablet 0    Sig: Take 1 tablet (100 mg total) by mouth 2 (two) times daily.   predniSONE  (DELTASONE ) 20 MG tablet 5 tablet 0    Sig: Take 1 tablet (20 mg total) by mouth daily with  breakfast.

## 2023-08-12 ENCOUNTER — Other Ambulatory Visit (INDEPENDENT_AMBULATORY_CARE_PROVIDER_SITE_OTHER)

## 2023-08-12 ENCOUNTER — Ambulatory Visit: Payer: Self-pay | Admitting: Family

## 2023-08-12 DIAGNOSIS — E1169 Type 2 diabetes mellitus with other specified complication: Secondary | ICD-10-CM | POA: Diagnosis not present

## 2023-08-12 LAB — COMPREHENSIVE METABOLIC PANEL WITH GFR
ALT: 10 U/L (ref 0–53)
AST: 16 U/L (ref 0–37)
Albumin: 4.7 g/dL (ref 3.5–5.2)
Alkaline Phosphatase: 89 U/L (ref 39–117)
BUN: 17 mg/dL (ref 6–23)
CO2: 30 meq/L (ref 19–32)
Calcium: 9.9 mg/dL (ref 8.4–10.5)
Chloride: 103 meq/L (ref 96–112)
Creatinine, Ser: 1.15 mg/dL (ref 0.40–1.50)
GFR: 74.16 mL/min (ref >60.00)
Glucose, Bld: 215 mg/dL — ABNORMAL HIGH (ref 70–99)
Potassium: 4.4 meq/L (ref 3.5–5.1)
Sodium: 141 meq/L (ref 135–145)
Total Bilirubin: 0.6 mg/dL (ref 0.2–1.2)
Total Protein: 7.1 g/dL (ref 6.0–8.3)

## 2023-08-12 LAB — HEMOGLOBIN A1C: Hgb A1c MFr Bld: 9.4 % — ABNORMAL HIGH (ref 4.6–6.5)

## 2023-08-26 ENCOUNTER — Other Ambulatory Visit: Payer: Self-pay | Admitting: Family

## 2023-09-06 ENCOUNTER — Other Ambulatory Visit: Payer: Self-pay | Admitting: Family

## 2023-09-26 ENCOUNTER — Other Ambulatory Visit: Payer: Self-pay | Admitting: Family

## 2023-10-08 ENCOUNTER — Other Ambulatory Visit: Payer: Self-pay | Admitting: Family

## 2023-10-21 ENCOUNTER — Ambulatory Visit: Payer: Self-pay

## 2023-10-21 NOTE — Telephone Encounter (Signed)
 Appt scheduled

## 2023-10-21 NOTE — Telephone Encounter (Signed)
 FYI Only or Action Required?: FYI only for provider.  Patient was last seen in primary care on 07/11/2023 by Jason Leita Repine, FNP.  Called Nurse Triage reporting Groin Swelling and Adenopathy.  Symptoms began a couple of days ago.  Interventions attempted: Ice/heat application.  Symptoms are: gradually worsening.  Triage Disposition: See Physician Within 24 Hours  Patient/caregiver understands and will follow disposition?: Yes                             Copied from CRM #[id number]. Topic: Clinical - Red Word Triage >> Oct 21, 2023  4:19 PM [place] J wrote: [facility] that prompted transfer to Nurse Triage: CAL called patient so he could be triaged due to a possible staff infection. Reason for Disposition  [1] Tender node in the groin AND [2] has a sore, scratch, cut or painful red area on that leg  Answer Assessment - Initial Assessment Questions 1. LOCATION: Where is the swollen node located? Is the matching node on the other side of the body also swollen?      Left groin 2. SIZE: How big is the node? (e.g., inches or centimeters; or compared to common objects such as pea, bean, marble, golf ball)      Quarter size 3. ONSET: When did the swelling start?      A couple of days ago 4. NECK NODES: Is there a sore throat, runny nose or other symptoms of a cold?      States he is losing his voice 5. GROIN OR ARMPIT NODES: Is there a sore, scratch, cut or painful red area on that arm or leg?      Yes, separate area of swelling near lymph node, possibly an ingrown hair 6. FEVER: Do you have a fever? If Yes, ask: What is it, how was it measured, and when did it start?      Denies 7. CAUSE: What do you think is causing the swollen lymph nodes?     Unsure 8. OTHER SYMPTOMS: Do you have any other symptoms? (e.g., node is tender to touch, skin redness over node, weight changes)     Headache, warm to the touch, drainage- white and  red    Patient called back after office LVM for triage.  Protocols used: Lymph Nodes - Swollen-A-AH

## 2023-10-22 ENCOUNTER — Ambulatory Visit: Admitting: Family

## 2023-10-22 ENCOUNTER — Ambulatory Visit: Admitting: Medical

## 2023-10-22 VITALS — BP 120/90 | HR 79 | Temp 97.9°F | Resp 16 | Ht 78.0 in | Wt 211.2 lb

## 2023-10-22 DIAGNOSIS — Z7985 Long-term (current) use of injectable non-insulin antidiabetic drugs: Secondary | ICD-10-CM | POA: Diagnosis not present

## 2023-10-22 DIAGNOSIS — E119 Type 2 diabetes mellitus without complications: Secondary | ICD-10-CM | POA: Diagnosis not present

## 2023-10-22 DIAGNOSIS — L089 Local infection of the skin and subcutaneous tissue, unspecified: Secondary | ICD-10-CM

## 2023-10-22 MED ORDER — DOXYCYCLINE HYCLATE 100 MG PO TABS
100.0000 mg | ORAL_TABLET | Freq: Two times a day (BID) | ORAL | 0 refills | Status: DC
Start: 2023-10-22 — End: 2023-12-05

## 2023-10-22 MED ORDER — METFORMIN HCL 500 MG PO TABS: 500.0000 mg | ORAL_TABLET | Freq: Every day | ORAL | 0 refills | Status: DC

## 2023-10-22 MED ORDER — CEFTRIAXONE SODIUM 1 G IJ SOLR
1.0000 g | Freq: Once | INTRAMUSCULAR | Status: AC
  Administered 2023-10-22: 1 g via INTRAMUSCULAR

## 2023-10-22 NOTE — Patient Instructions (Addendum)
 Skin infection of groin(early cellulitis possible vs forming abscess) Acute groin infection, 3x2 cm, with central scab and  some serosanguinous and at time faint yellow watery discharge. No abscess presently. Likely staphylococcal, worsened by uncontrolled diabetes. - Administered Rocephin  1 gram injection. - Prescribed doxycycline . - Advised warm salt water compresses. - Scheduled follow-up in 2 days. - Instructed to seek emergency care for increased swelling, tenderness, or erythema.  Type 2 diabetes mellitus Type 2 diabetes with A1c 9.4%. Previously on Mounjaro , discontinued due to weight loss. Currently untreated, high glucose may worsen infections. - Prescribed metformin  500 mg daily. - Advised A1c test around October 7th. - Discussed low sugar diet and healthy eating.  Follow up in 2 days or sooner if needed

## 2023-10-22 NOTE — Progress Notes (Signed)
 Subjective:    Patient ID: Francisco Lawson, male    DOB: [birth year], 53 y.o.   MRN: [id number]  HPI  Francisco Lawson is a 53 year old male with diabetes who presents with a suspected skin infection in the groin area.  He developed a swollen area in the groin region approximately four days ago. Initially, the lesion was about the size of a quarter and has since increased in size. The discharge from the lesion is described as 'watery yellowish blood.' He has been applying hot compresses and notices drainage during showers. No fever, chills, or sweats are present.  He recalls a similar incident years ago on his thigh, which was suspected to be a staph infection. He has no known allergies to antibiotics but cannot tolerate NSAIDs like ibuprofen due to oral mucosal irritation.  He has a history of diabetes and was previously on Mounjaro , which he stopped three weeks ago due to significant weight loss. He reports a weight decrease from 236 to 211 pounds. He is not currently on any diabetes medication but has been on Levemir and Trulicity in the past. He attributes some of his weight loss to stress and lack of sleep due to caring for his eight-month-old child.       Review of Systems  Constitutional:  Negative for chills, fatigue and fever.  HENT:  Negative for congestion and ear pain.   Respiratory:  Negative for cough, chest tightness and wheezing.   Cardiovascular:  Negative for chest pain and palpitations.  Gastrointestinal:  Negative for abdominal pain, blood in stool, diarrhea and nausea.  Genitourinary:  Negative for dysuria and frequency.  Musculoskeletal:  Negative for back pain, myalgias and neck pain.  Skin:        Skin infection.  Neurological:  Negative for dizziness, seizures, weakness and light-headedness.  Hematological:  Negative for adenopathy. Does not bruise/bleed easily.  Psychiatric/Behavioral:  Negative for behavioral problems and decreased concentration.     Past  Medical History:  Diagnosis Date   Arthritis    Asthma    Diabetes mellitus without complication (HCC)    DVT (deep venous thrombosis) (HCC) 11/12/2016   Erythema multiforme    Pneumothorax    Stevens-Johnson syndrome (HCC)    from ASA and NSAIDs     Social History   Socioeconomic History   Marital status: Married    Spouse name: Not on file   Number of children: Not on file   Years of education: Not on file   Highest education level: Not on file  Occupational History   Not on file  Tobacco Use   Smoking status: Never   Smokeless tobacco: Never  Substance and Sexual Activity   Alcohol use: Yes    Comment: RARE   Drug use: No   Sexual activity: Not on file  Other Topics Concern   Not on file  Social History Narrative   Not on file   Social Drivers of Health   Financial Resource Strain: Low Risk  (10/03/2022)   Overall Financial Resource Strain (CARDIA)    Difficulty of Paying Living Expenses: Not hard at all  Food Insecurity: Low Risk  (11/22/2022)   Received from [facility]   Hunger Vital Sign    Within the past 12 months, you worried that your food would run out before you got money to buy more: Never true    Within the past 12 months, the food you bought just didn't last and you didn't have  money to get more. : Never true  Transportation Needs: No Transportation Needs (11/22/2022)   Received from [facility]    In the past 12 months, has lack of reliable transportation kept you from medical appointments, meetings, work or from getting things needed for daily living? : No  Physical Activity: Sufficiently Active (10/03/2022)   Exercise Vital Sign    Days of Exercise per Week: 7 days    Minutes of Exercise per Session: 30 min  Stress: No Stress Concern Present (10/03/2022)   [employer] of Occupational Health - Occupational Stress Questionnaire    Feeling of Stress : Not at all  Social Connections: Unknown (10/03/2022)   Social  Connection and Isolation Panel    Frequency of Communication with Friends and Family: More than three times a week    Frequency of Social Gatherings with Friends and Family: Once a week    Attends Religious Services: Patient declined    Database administrator or Organizations: Yes    Attends Engineer, structural: 1 to 4 times per year    Marital Status: Married  Catering manager Violence: Not on file    Past Surgical History:  Procedure Laterality Date   Biapical lung resection  2010   CHEST TUBE INSERTION     KNEE ARTHROSCOPY Right    LYMPH GLAND EXCISION     SPINE SURGERY      Family History  Problem Relation Age of Onset   Heart disease Father    Early death Father    Hypertension Father    Hyperlipidemia Father    Heart attack Father    Sudden death Father    Heart disease Mother    Hypertension Mother    Cancer Mother    Arthritis Mother     Allergies  Allergen Reactions   Aspirin Other (See Comments)    Stevens-Johnson Syndrome   Excedrin Migraine [Aspirin-Acetaminophen -Caffeine] Other (See Comments)    Stevens-Johnson Syndrome   Nsaids Other (See Comments)    Stevens-Johnson Syndrome   Statins     Skin sensitivity/ unable to tolerate   Tolmetin Other (See Comments)    Stevens-Johnson Syndrome    Current Outpatient Medications on File Prior to Visit  Medication Sig Dispense Refill   acetaminophen  (TYLENOL ) 500 MG tablet Take 1,000 mg by mouth every 4 (four) hours as needed.     cetirizine (ZYRTEC) 10 MG tablet Take 10 mg by mouth daily as needed for allergies or rhinitis.     doxycycline  (VIBRA -TABS) 100 MG tablet Take 1 tablet (100 mg total) by mouth 2 (two) times daily. 14 tablet 0   hydrocortisone cream 1 % Apply 1 application topically daily as needed for itching.     methocarbamol  (ROBAXIN ) 500 MG tablet Take 1 tablet (500 mg total) by mouth every 8 (eight) hours as needed for muscle spasms. 30 tablet 0   MOUNJARO  7.5 MG/0.5ML Pen INJECT 1 PEN  SUBCUTANEOUSLY ONCE A WEEK 4 mL 0   predniSONE  (DELTASONE ) 20 MG tablet Take 1 tablet (20 mg total) by mouth daily with breakfast. 5 tablet 0   REPATHA  SURECLICK 140 MG/ML SOAJ INJECT CONTENTS OF 1 PEN SUBCUTANEOUSLY EVERY TWO WEEKS 2 mL 0   No current facility-administered medications on file prior to visit.    BP (!) 120/90   Pulse 79   Temp 97.9 F (36.6 C) (Oral)   Resp 16   Ht 6' 6 (1.981 m)   Wt 211 lb 3.2  oz (95.8 kg)   SpO2 96%   BMI 24.41 kg/m          Objective:   Physical Exam  General Mental Status- Alert. General Appearance- Not in acute distress.   Skin General: Color- Normal Color. Moisture- Normal Moisture.  Neck Carotid Arteries- Normal color. Moisture- Normal Moisture. No carotid bruits. No JVD.  Chest and Lung Exam Auscultation: Breath Sounds:-CTA  Cardiovascular Auscultation:Rythm- RRR Murmurs & Other Heart Sounds:Auscultation of the heart reveals- No Murmurs.  Abdomen Inspection:-Inspeection Normal. Palpation/Percussion:Note:No mass. Palpation and Percussion of the abdomen reveal- Non Tender, Non Distended + BS, no rebound or guarding.   Neurologic Cranial Nerve exam:- CN III-XII intact(No nystagmus), symmetric smile. Strength:- 5/5 equal and symmetric strength both upper and lower extremities.   Left groin area- 3 cm x 2 cm mild red area mild swollen. Mild indurated in area. Small scab in center. No dc. On palpation in area and below no hernia.      Assessment & Plan:   Patient Instructions  Skin infection of groin(early cellulitis possible vs forming abscess) Acute groin infection, 3x2 cm, with central scab and  some serosanguinous and at time faint yellow watery discharge. No abscess presently. Likely staphylococcal, worsened by uncontrolled diabetes. - Administered Rocephin  1 gram injection. - Prescribed doxycycline . - Advised warm salt water compresses. - Scheduled follow-up in 2 days. - Instructed to seek emergency care for  increased swelling, tenderness, or erythema.  Type 2 diabetes mellitus Type 2 diabetes with A1c 9.4%. Previously on Mounjaro , discontinued due to weight loss. Currently untreated, high glucose may worsen infections. - Prescribed metformin  500 mg daily. - Advised A1c test around October 7th. - Discussed low sugar diet and healthy eating.  Follow up in 2 days or sooner if needed   [employer], PA-C

## 2023-10-23 ENCOUNTER — Ambulatory Visit: Payer: Self-pay | Admitting: Medical

## 2023-10-24 ENCOUNTER — Other Ambulatory Visit: Payer: Self-pay | Admitting: Family

## 2023-10-24 ENCOUNTER — Ambulatory Visit (INDEPENDENT_AMBULATORY_CARE_PROVIDER_SITE_OTHER): Admitting: Medical

## 2023-10-24 VITALS — BP 128/82 | HR 79 | Temp 97.9°F | Resp 15 | Ht 78.0 in | Wt 215.8 lb

## 2023-10-24 DIAGNOSIS — L089 Local infection of the skin and subcutaneous tissue, unspecified: Secondary | ICD-10-CM

## 2023-10-24 DIAGNOSIS — E119 Type 2 diabetes mellitus without complications: Secondary | ICD-10-CM

## 2023-10-24 MED ORDER — CEFTRIAXONE SODIUM 1 G IJ SOLR
1.0000 g | Freq: Once | INTRAMUSCULAR | Status: AC
  Administered 2023-10-24: 1 g via INTRAMUSCULAR

## 2023-10-24 NOTE — Progress Notes (Signed)
 Pt has an apt today can go over results then

## 2023-10-24 NOTE — Addendum Note (Signed)
 Addended by: GERARD CHUCKIE SAILOR on: 10/24/2023 11:38 AM   Modules accepted: Orders

## 2023-10-24 NOTE — Patient Instructions (Addendum)
 Left groin skin infection with some interval formation of small  abscess due to Staphylococcus infection. Area is draining. Draining and reduced in size by 50 with salt water compresses. Initial culture shows light growth of Staphylococcus. Doxycycline  likely adequate pending sensitivity report. Monitoring necessary for complete resolution. Possible need for incision and drainage if not adequately draining/flattened out by Monday. - Continue doxycycline  as prescribed. 1 gram rocephin  given today again. - Send a MyChart message with a picture by Monday and let me know if flattened out or note. - Consider referral to a surgeon for incision and drainage if the abscess does not adequately drain by Monday  Type 2 diabetes mellitus Type 2 diabetes mellitus managed with metformin . Future A1c test scheduled to monitor glycemic control. A1c test should not be performed sooner than October 7th for accuracy. - Continue metformin  once daily. - Schedule A1c test for October 7th or later. - Order metabolic panel.  Follow up date to be determined after my chart update on Monday.

## 2023-10-24 NOTE — Progress Notes (Signed)
 Subjective:    Patient ID: Francisco Lawson, male    DOB: 1970/04/15, 53 y.o.   MRN: [id number]  HPI   Patient Instructions  Skin infection of groin(early cellulitis possible vs forming abscess) Acute groin infection, 3x2 cm, with central scab and  some serosanguinous and at time faint yellow watery discharge. No abscess presently. Likely staphylococcal, worsened by uncontrolled diabetes. - Administered Rocephin  1 gram injection. - Prescribed doxycycline . - Advised warm salt water compresses. - Scheduled follow-up in 2 days. - Instructed to seek emergency care for increased swelling, tenderness, or erythema.   Type 2 diabetes mellitus Type 2 diabetes with A1c 9.4%. Previously on Mounjaro , discontinued due to weight loss. Currently untreated, high glucose may worsen infections. - Prescribed metformin  500 mg daily. - Advised A1c test around October 7th. - Discussed low sugar diet and healthy eating.   Follow up in 2 days or sooner if needed   Francisco Lawson is a 53 year old male who presents with a left groin area infection. He is accompanied by his wife.    He has been taking doxycycline , which initially caused stomach cramping and flushing, but he reports feeling better and regaining his appetite. A culture of the area showed light growth of staph. The infection has reduced in size from 3x2 cm to approximately 1.5x0.5 cm, with a small hole in the center that continues to drain.  He received a Rocephin  injection previously and continues on doxycycline . He is also taking metformin  once a day.  No fevers, chills, or sweats. Pt overall states area feels better, less swollen and decreased in size.     Review of Systems  Constitutional:  Negative for chills, fatigue and fever.  HENT:  Negative for congestion and ear pain.   Respiratory:  Negative for cough, chest tightness and wheezing.   Cardiovascular:  Negative for chest pain and palpitations.  Gastrointestinal:  Negative  for abdominal pain, blood in stool, diarrhea and nausea.  Genitourinary:  Negative for dysuria and frequency.  Musculoskeletal:  Negative for back pain, myalgias and neck pain.  Skin:        Skin infection.  Neurological:  Negative for dizziness, seizures, weakness and light-headedness.  Hematological:  Negative for adenopathy. Does not bruise/bleed easily.  Psychiatric/Behavioral:  Negative for behavioral problems and decreased concentration.      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     Social History   Socioeconomic History   Marital status: Married    Spouse name: Not on file   Number of children: Not on file   Years of education: Not on file   Highest education level: Not on file  Occupational History   Not on file  Tobacco Use   Smoking status: Never   Smokeless tobacco: Never  Substance and Sexual Activity   Alcohol use: Yes    Comment: RARE   Drug use: No   Sexual activity: Not on file  Other Topics Concern   Not on file  Social History Narrative   Not on file   Social Drivers of Health   Financial Resource Strain: Low Risk  (10/03/2022)   Overall Financial Resource Strain (CARDIA)    Difficulty of Paying Living Expenses: Not hard at  all  Food Insecurity: Low Risk  (11/22/2022)   Received from [facility]   Hunger Vital Sign    Within the past 12 months, you worried that your food would run out before you got money to buy more: Never true    Within the past 12 months, the food you bought just didn't last and you didn't have money to get more. : Never true  Transportation Needs: No Transportation Needs (11/22/2022)   Received from [facility]    In the past 12 months, has lack of reliable transportation kept you from  medical appointments, meetings, work or from getting things needed for daily living? : No  Physical Activity: Sufficiently Active (10/03/2022)   Exercise Vital Sign    Days of Exercise per Week: 7 days    Minutes of Exercise per Session: 30 min  Stress: No Stress Concern Present (10/03/2022)   [employer] of Occupational Health - Occupational Stress Questionnaire    Feeling of Stress : Not at all  Social Connections: Unknown (10/03/2022)   Social Connection and Isolation Panel    Frequency of Communication with Friends and Family: More than three times a week    Frequency of Social Gatherings with Friends and Family: Once a week    Attends Religious Services: Patient declined    Database administrator or Organizations: Yes    Attends Engineer, structural: 1 to 4 times per year    Marital Status: Married  Catering manager Violence: Not on file    Past Surgical History:  Procedure Laterality Date   Biapical lung resection  2010   CHEST TUBE INSERTION     KNEE ARTHROSCOPY Right    LYMPH GLAND EXCISION     SPINE SURGERY      Family History  Problem Relation Age of Onset   Heart disease Father    Early death Father    Hypertension Father    Hyperlipidemia Father    Heart attack Father    Sudden death Father    Heart disease Mother    Hypertension Mother    Cancer Mother    Arthritis Mother     Allergies  Allergen Reactions   Aspirin Other (See Comments)    Stevens-Johnson Syndrome   Excedrin Migraine [Aspirin-Acetaminophen -Caffeine] Other (See Comments)    Stevens-Johnson Syndrome   Nsaids Other (See Comments)    Stevens-Johnson Syndrome   Statins     Skin sensitivity/ unable to tolerate   Tolmetin Other (See Comments)    Stevens-Johnson Syndrome    Current Outpatient Medications on File Prior to Visit  Medication Sig Dispense Refill   acetaminophen  (TYLENOL ) 500 MG tablet Take 1,000 mg by mouth every 4 (four) hours as needed.     cetirizine (ZYRTEC)  10 MG tablet Take 10 mg by mouth daily as needed for allergies or rhinitis.     doxycycline  (VIBRA -TABS) 100 MG tablet Take 1 tablet (100 mg total) by mouth 2 (two) times daily. 14 tablet 0   doxycycline  (VIBRA -TABS) 100 MG tablet Take 1 tablet (100 mg total) by mouth 2 (two) times daily. 20 tablet 0   hydrocortisone cream 1 % Apply 1 application topically daily as needed for itching.     metFORMIN  (GLUCOPHAGE ) 500 MG tablet Take 1 tablet (500 mg total) by mouth daily with breakfast. 30 tablet 0   methocarbamol  (ROBAXIN ) 500 MG tablet Take 1 tablet (500 mg total) by mouth every 8 (eight) hours as needed for muscle  spasms. 30 tablet 0   MOUNJARO  7.5 MG/0.5ML Pen INJECT 1 PEN SUBCUTANEOUSLY ONCE A WEEK 4 mL 0   predniSONE  (DELTASONE ) 20 MG tablet Take 1 tablet (20 mg total) by mouth daily with breakfast. 5 tablet 0   REPATHA  SURECLICK 140 MG/ML SOAJ INJECT CONTENTS OF 1 PEN SUBCUTANEOUSLY EVERY TWO WEEKS 2 mL 0   No current facility-administered medications on file prior to visit.    BP 128/82   Pulse 79   Temp 97.9 F (36.6 C) (Oral)   Resp 15   Ht 6' 6 (1.981 m)   Wt 215 lb 12.8 oz (97.9 kg)   SpO2 98%   BMI 24.94 kg/m        Objective:   Physical Exam  Physical Exam   General Mental Status- Alert. General Appearance- Not in acute distress.    Skin General: Color- Normal Color. Moisture- Normal Moisture.   Neck No JVD.   Chest and Lung Exam Auscultation: Breath Sounds:-CTA   Cardiovascular Auscultation:Rythm- RRR Murmurs & Other Heart Sounds:Auscultation of the heart reveals- No Murmurs.   Abdomen Inspection:-Inspeection Normal. Palpation/Percussion:Note:No mass. Palpation and Percussion of the abdomen reveal- Non Tender, Non Distended + BS, no rebound or guarding.     Neurologic Cranial Nerve exam:- CN III-XII intact(No nystagmus), symmetric smile. Strength:- 5/5 equal and symmetric strength both upper and lower extremities.    Left groin area- 1.5 cm x 1 cm  mild red area mild swollen. Mild indurated in area. Small scab in center. No dc. On palpation in area and below no hernia.      Assessment & Plan:   Patient Instructions  Left groin skin infection with some interval formation of small  abscess due to Staphylococcus infection. Area is draining. Draining and reduced in size by 50 with salt water compresses. Initial culture shows light growth of Staphylococcus. Doxycycline  likely adequate pending sensitivity report. Monitoring necessary for complete resolution. Possible need for incision and drainage if not adequately draining/flattened out by Monday. - Continue doxycycline  as prescribed. - Send a MyChart message with a picture by Monday and let me know if flattened out or note. - Consider referral to a surgeon for incision and drainage if the abscess does not adequately drain by Monday  Type 2 diabetes mellitus Type 2 diabetes mellitus managed with metformin . Future A1c test scheduled to monitor glycemic control. A1c test should not be performed sooner than October 7th for accuracy. - Continue metformin  once daily. - Schedule A1c test for October 7th or later. - Order metabolic panel.  Follow up date to be determined after my chart update on Monday.   Majestic Brister, PA-C

## 2023-10-25 LAB — WOUND CULTURE
MICRO NUMBER:: 16973908
SPECIMEN QUALITY:: ADEQUATE

## 2023-11-11 ENCOUNTER — Other Ambulatory Visit (INDEPENDENT_AMBULATORY_CARE_PROVIDER_SITE_OTHER)

## 2023-11-11 ENCOUNTER — Other Ambulatory Visit: Payer: Self-pay

## 2023-11-11 DIAGNOSIS — E119 Type 2 diabetes mellitus without complications: Secondary | ICD-10-CM

## 2023-11-11 LAB — COMPREHENSIVE METABOLIC PANEL WITH GFR
ALT: 8 U/L (ref 0–53)
AST: 15 U/L (ref 0–37)
Albumin: 4.4 g/dL (ref 3.5–5.2)
Alkaline Phosphatase: 88 U/L (ref 39–117)
BUN: 15 mg/dL (ref 6–23)
CO2: 27 meq/L (ref 19–32)
Calcium: 9.6 mg/dL (ref 8.4–10.5)
Chloride: 104 meq/L (ref 96–112)
Creatinine, Ser: 0.96 mg/dL (ref 0.40–1.50)
GFR: 91.94 mL/min (ref >60.00)
Glucose, Bld: 169 mg/dL — ABNORMAL HIGH (ref 70–99)
Potassium: 4.2 meq/L (ref 3.5–5.1)
Sodium: 143 meq/L (ref 135–145)
Total Bilirubin: 0.5 mg/dL (ref 0.2–1.2)
Total Protein: 6.8 g/dL (ref 6.0–8.3)

## 2023-11-11 LAB — HEMOGLOBIN A1C: Hgb A1c MFr Bld: 12 % — ABNORMAL HIGH (ref 4.6–6.5)

## 2023-11-11 MED ORDER — REPATHA SURECLICK 140 MG/ML ~~LOC~~ SOAJ: 140.0000 mg | SUBCUTANEOUS | 0 refills | Status: DC

## 2023-11-11 NOTE — Telephone Encounter (Unsigned)
 Copied from CRM #[id number]. Topic: Clinical - Medication Refill >> Nov 11, 2023 11:29 AM Roselie BROCKS wrote: Medication: REPATHA  SURECLICK 140 MG/ML SOAJ  Has the patient contacted their pharmacy? Yes (Agent: If no, request that the patient contact the pharmacy for the refill. If patient does not wish to contact the pharmacy document the reason why and proceed with request.) (Agent: If yes, when and what did the pharmacy advise?)  This is the patient's preferred pharmacy:  [facility] [address] [place], [place] - [address] [place] [address] Phone: [phone] Fax: [phone]   Is this the correct pharmacy for this prescription? Yes If no, delete pharmacy and type the correct one.   Has the prescription been filled recently? Yes  Is the patient out of the medication? Yes  Has the patient been seen for an appointment in the last year OR does the patient have an upcoming appointment? Yes  Can we respond through MyChart? No  Agent: Please be advised that Rx refills may take up to 3 business days. We ask that you follow-up with your pharmacy.

## 2023-11-12 ENCOUNTER — Other Ambulatory Visit

## 2023-11-13 NOTE — Progress Notes (Signed)
 Mailed off results and result note

## 2023-11-15 ENCOUNTER — Other Ambulatory Visit: Payer: Self-pay | Admitting: Medical

## 2023-11-19 ENCOUNTER — Other Ambulatory Visit: Payer: Self-pay | Admitting: Family

## 2023-11-19 MED ORDER — REPATHA SURECLICK 140 MG/ML ~~LOC~~ SOAJ: 140.0000 mg | SUBCUTANEOUS | 0 refills | Status: DC

## 2023-11-19 NOTE — Telephone Encounter (Unsigned)
 Copied from CRM [id number]. Topic: Clinical - Medication Refill >> Nov 19, 2023  2:16 PM [place] J wrote: Medication: Evolocumab  (REPATHA  SURECLICK) 140 MG/ML SOAJ  Has the patient contacted their pharmacy? Yes (Agent: If no, request that the patient contact the pharmacy for the refill. If patient does not wish to contact the pharmacy document the reason why and proceed with request.) (Agent: If yes, when and what did the pharmacy advise?)  This is the patient's preferred pharmacy:  [facility] [address] [place], [place] - [address] [place] [address] Phone: [phone] Fax: [phone]  Is this the correct pharmacy for this prescription? Yes If no, delete pharmacy and type the correct one.   Has the prescription been filled recently? Yes  Is the patient out of the medication? No  Has the patient been seen for an appointment in the last year OR does the patient have an upcoming appointment? Yes  Can we respond through MyChart? Yes  Agent: Please be advised that Rx refills may take up to 3 business days. We ask that you follow-up with your pharmacy.

## 2023-12-05 ENCOUNTER — Ambulatory Visit: Admitting: Student

## 2023-12-05 ENCOUNTER — Ambulatory Visit: Payer: Self-pay

## 2023-12-05 ENCOUNTER — Encounter: Payer: Self-pay | Admitting: Student

## 2023-12-05 VITALS — BP 114/74 | HR 100 | Temp 97.6°F | Ht 78.0 in | Wt 212.2 lb

## 2023-12-05 DIAGNOSIS — E1169 Type 2 diabetes mellitus with other specified complication: Secondary | ICD-10-CM | POA: Diagnosis not present

## 2023-12-05 DIAGNOSIS — Z7984 Long term (current) use of oral hypoglycemic drugs: Secondary | ICD-10-CM

## 2023-12-05 DIAGNOSIS — L089 Local infection of the skin and subcutaneous tissue, unspecified: Secondary | ICD-10-CM | POA: Diagnosis not present

## 2023-12-05 DIAGNOSIS — R7309 Other abnormal glucose: Secondary | ICD-10-CM

## 2023-12-05 DIAGNOSIS — E119 Type 2 diabetes mellitus without complications: Secondary | ICD-10-CM | POA: Insufficient documentation

## 2023-12-05 MED ORDER — METFORMIN HCL 1000 MG PO TABS: 1000.0000 mg | ORAL_TABLET | Freq: Two times a day (BID) | ORAL | 3 refills | Status: DC

## 2023-12-05 MED ORDER — CEPHALEXIN 500 MG PO CAPS: 500.0000 mg | ORAL_CAPSULE | Freq: Three times a day (TID) | ORAL | 0 refills | Status: AC

## 2023-12-05 NOTE — Progress Notes (Signed)
   Acute Office Visit  Subjective:     Patient ID: Francisco Lawson, male    DOB: [birth year], 53 y.o.   MRN: [id number]  No chief complaint on file.   HPI   History of Present Illness Francisco Lawson is a 53 year old male with diabetes who presents with recurrent skin infections.  Wt Readings from Last 3 Encounters:  12/05/23 212 lb 3.2 oz (96.3 kg)  10/24/23 215 lb 12.8 oz (97.9 kg)  10/22/23 211 lb 3.2 oz (95.8 kg)     He has experienced skin infections for the past month and a half. Initially treated with doxycycline  and rocephin  on 10/22/2023, still has painful area on the inside of his thigh with mild redness, and light discharge upon pressure. Has improved since prior OV.  He manages diabetes with metformin . He reports symptoms of consistent thirst and burning throat, though not currently experiencing thirst. He also notes kidney area pain and poor vision in one eye.  Patient denies fever, chills, SOB, CP, palpitations, dyspnea, edema, HA, vision changes, N/V/D, abdominal pain, urinary symptoms, and recent illness or hospitalizations.   ROS  See HPI    Objective:    BP 114/74   Pulse 100   Temp 97.6 F (36.4 C)   Ht 6' 6 (1.981 m)   Wt 212 lb 3.2 oz (96.3 kg)   SpO2 96%   BMI 24.52 kg/m     Physical Exam  General: No acute distress. Awake and conversant.  Eyes: Normal conjunctiva, anicteric. Round symmetric pupils.  Respiratory: CTAB. Respirations are non-labored. No wheezing.  Skin: Warm.  +right inner thigh- hard cyst, minimal serosanguinous drainage, mild erythema. NTWP. +left buttock- hard cyst, closed, no drainage, mild erythema. NTWP. Psych: Alert and oriented. Cooperative, Appropriate mood and affect, Normal judgment.  CV: RRR. No murmur. No lower extremity edema.  MSK: Normal ambulation. No clubbing or cyanosis.  Neuro:  CN II-XII grossly normal.    No results found for any visits on 12/05/23.      Assessment & Plan:   Problem List Items  Addressed This Visit     Diabetes mellitus (HCC) - Primary   Poorly controlled. Last A1C 12%. Increase Metformin  to 1000 mg BID. RTC in 4 weeks for FU.      Relevant Medications   metFORMIN  (GLUCOPHAGE ) 1000 MG tablet   cephALEXin  (KEFLEX ) 500 MG capsule   Skin infection    Right thigh and left buttock. Differential includes folliculitis vs cyst.  - Rx- Keflex  -Warm, moist compress TID over areas to promote draiange; do not squeeze lesions to drain -RTC if no improvement      Relevant Medications   cephALEXin  (KEFLEX ) 500 MG capsule    Meds ordered this encounter  Medications   metFORMIN  (GLUCOPHAGE ) 1000 MG tablet    Sig: Take 1 tablet (1,000 mg total) by mouth 2 (two) times daily with a meal.    Dispense:  180 tablet    Refill:  3    Supervising Provider:   DOMENICA BLACKBIRD A [[id number]]   cephALEXin  (KEFLEX ) 500 MG capsule    Sig: Take 1 capsule (500 mg total) by mouth 3 (three) times daily for 7 days.    Dispense:  21 capsule    Refill:  0    Supervising Provider:   DOMENICA BLACKBIRD A [[id number]]    Return in about 4 weeks (around 01/02/2024).  Ifrah Vest L Margalit Leece, NP

## 2023-12-05 NOTE — Telephone Encounter (Signed)
 FYI Only or Action Required?: FYI only for provider: appointment scheduled on 10/30.  Patient was last seen in primary care on 10/24/2023 by Saguier, Edward, PA-C.  Called Nurse Triage reporting Cellulitis.  Symptoms began x 2 days ago.  Interventions attempted: Rest, hydration, or home remedies.  Symptoms are: gradually worsening.  Triage Disposition: See HCP Within 4 Hours (Or PCP Triage)  Patient/caregiver understands and will follow disposition?: Yes                     Copied from CRM [id number]. Topic: Clinical - Red Word Triage >> Dec 05, 2023  8:16 AM Carlyon D wrote: Red Word that prompted transfer to Nurse Triage: intense severe  pain inner R thigh/ Upper L butt cheek as well possible cellulitis. Swelling and redness Reason for Disposition  SEVERE leg swelling (e.g., swelling extends above knee, entire leg is swollen, weeping fluid)  Answer Assessment - Initial Assessment Questions 1. ONSET: When did the swelling start? (e.g., minutes, hours, days)     X 2 days   2. LOCATION: What part of the leg is swollen?  Are both legs swollen or just one leg?     R thigh/ Upper L butt cheek  3. SEVERITY: How bad is the swelling? (e.g., localized; mild, moderate, severe)     Localized nickel sized swelling on buttock area  4. REDNESS: Is there redness or signs of infection?     Swelling and redness noted, thigh drainage noted  5. PAIN: Is the swelling painful to touch? If Yes, ask: How painful is it?   (Scale 1-10; mild, moderate or severe)      L buttock-burning 8/10 Thigh pain 8/10  6. FEVER: Do you have a fever? If Yes, ask: What is it, how was it measured, and when did it start?      No   7. CAUSE: What do you think is causing the leg swelling?      Possible cellulitis flare up   8. MEDICAL HISTORY: Do you have a history of blood clots (e.g., DVT), cancer, heart failure, kidney disease, or liver failure?     DVT  9. RECURRENT  SYMPTOM: Have you had leg swelling before? If Yes, ask: When was the last time? What happened that time?     Yes, x 1.5  month ago   10. OTHER SYMPTOMS: Do you have any other symptoms? (e.g., chest pain, difficulty breathing)  No   Appt. Scheduled.  Protocols used: Leg Swelling and Edema-A-AH

## 2023-12-05 NOTE — Telephone Encounter (Signed)
 Appt scheduled

## 2023-12-05 NOTE — Assessment & Plan Note (Addendum)
 Poorly controlled. Last A1C 12%. Increase Metformin  to 1000 mg BID. RTC in 4 weeks for FU.

## 2023-12-05 NOTE — Assessment & Plan Note (Addendum)
 Right thigh and left buttock. Differential includes folliculitis vs cyst.  - Rx- Keflex  -Warm, moist compress TID over areas to promote draiange; do not squeeze lesions to drain -RTC if no improvement

## 2023-12-06 ENCOUNTER — Telehealth: Payer: Self-pay | Admitting: Family

## 2023-12-06 ENCOUNTER — Telehealth: Payer: Self-pay | Admitting: Student

## 2023-12-06 ENCOUNTER — Telehealth: Payer: Self-pay | Admitting: *Deleted

## 2023-12-06 DIAGNOSIS — R7309 Other abnormal glucose: Secondary | ICD-10-CM

## 2023-12-06 DIAGNOSIS — E1169 Type 2 diabetes mellitus with other specified complication: Secondary | ICD-10-CM

## 2023-12-06 MED ORDER — INSULIN GLARGINE 100 UNIT/ML SOLOSTAR PEN
10.0000 [IU] | PEN_INJECTOR | Freq: Every day | SUBCUTANEOUS | 11 refills | Status: DC
Start: 2023-12-06 — End: 2023-12-06

## 2023-12-06 MED ORDER — INSULIN GLARGINE 100 UNIT/ML SOLOSTAR PEN: 10.0000 [IU] | PEN_INJECTOR | Freq: Every day | SUBCUTANEOUS | 11 refills | Status: DC

## 2023-12-06 MED ORDER — INSULIN GLARGINE 100 UNIT/ML SOLOSTAR PEN: 10.0000 [IU] | PEN_INJECTOR | Freq: Every day | SUBCUTANEOUS | 11 refills | Status: AC

## 2023-12-06 NOTE — Telephone Encounter (Signed)
 Copied from CRM [id number]. Topic: Clinical - Prescription Issue >> Dec 06, 2023 11:59 AM Macario HERO wrote: Reason for CRM: Patient called said that [facility] said they did not receive prescription for insulin glargine (LANTUS) 100 UNIT/ML Solostar Pen [[id number]]

## 2023-12-06 NOTE — Addendum Note (Signed)
 Addended by: WHEELER HARLENE CROME on: 12/06/2023 05:04 PM   Modules accepted: Orders

## 2023-12-06 NOTE — Telephone Encounter (Signed)
 Spoke with pt and explained all provider directions.  Pt voiced understanding.  He is scheduled for a f/u on 01/07/24.

## 2023-12-06 NOTE — Telephone Encounter (Signed)
 Rx resent.

## 2023-12-06 NOTE — Telephone Encounter (Signed)
 Copied from CRM #[id number]. Topic: Clinical - Prescription Issue >> Dec 06, 2023  2:30 PM Brittany M wrote: Reason for CRM: [facility] pharmacy calling- Lantus needs to have the max daily dosage listed on prescription

## 2023-12-06 NOTE — Addendum Note (Signed)
 Addended by: WHEELER HARLENE CROME on: 12/06/2023 09:09 AM   Modules accepted: Orders

## 2023-12-06 NOTE — Addendum Note (Signed)
 Addended by: WHEELER HARLENE CROME on: 12/06/2023 05:05 PM   Modules accepted: Orders

## 2024-01-07 ENCOUNTER — Ambulatory Visit: Admitting: Student

## 2024-01-07 ENCOUNTER — Encounter: Payer: Self-pay | Admitting: Student

## 2024-01-07 NOTE — Progress Notes (Unsigned)
 Subjective:     Patient ID: Francisco Lawson, male    DOB: 1970/02/23, 53 y.o.   MRN: [id number]  No chief complaint on file.   HPI   ERROR- PT NO SHOW   Discussed the use of AI scribe software for clinical note transcription with the patient, who gave verbal consent to proceed.  History of Present Illness              Health Maintenance Due  Topic Date Due   Medicare Annual Wellness (AWV)  Never done   FOOT EXAM  Never done   OPHTHALMOLOGY EXAM  Never done   Diabetic kidney evaluation - Urine ACR  Never done   Pneumococcal Vaccine: 50+ Years (1 of 2 - PCV) Never done   Hepatitis B Vaccines 19-59 Average Risk (1 of 3 - 19+ 3-dose series) Never done   Zoster Vaccines- Shingrix (1 of 2) Never done   Influenza Vaccine  Never done   COVID-19 Vaccine (5 - 2025-26 season) 10/07/2023    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    Past Surgical History:  Procedure Laterality Date   Biapical lung resection  2010   CHEST TUBE INSERTION     KNEE ARTHROSCOPY Right    LYMPH GLAND EXCISION     SPINE SURGERY      Family History  Problem Relation Age of Onset   Heart disease Father    Early death Father    Hypertension Father    Hyperlipidemia Father    Heart attack Father    Sudden death Father    Heart disease Mother    Hypertension Mother    Cancer Mother    Arthritis Mother     Social History   Socioeconomic History   Marital status: Married    Spouse name: Not on file   Number of children: Not on file   Years of education: Not on file   Highest education level: Not on file  Occupational History   Not on file  Tobacco Use   Smoking status: Never   Smokeless tobacco: Never  Substance and Sexual Activity   Alcohol use: Yes    Comment: RARE    Drug use: No   Sexual activity: Not on file  Other Topics Concern   Not on file  Social History Narrative   Not on file   Social Drivers of Health   Financial Resource Strain: Low Risk  (10/03/2022)   Overall Financial Resource Strain (CARDIA)    Difficulty of Paying Living Expenses: Not hard at all  Food Insecurity: Low Risk  (11/22/2022)   Received from [facility]   Hunger Vital Sign    Within the past 12 months, you worried that your food would run out before you got money to buy more: Never true    Within the past 12 months, the food you bought just didn't last and you didn't have money to get more. : Never true  Transportation Needs: No Transportation Needs (11/22/2022)   Received from [facility]    In the past 12 months, has lack of reliable transportation kept you from medical appointments, meetings, work or from getting things needed for daily living? : No  Physical Activity: Sufficiently Active (10/03/2022)   Exercise Vital Sign    Days of Exercise per Week: 7 days    Minutes of Exercise per Session: 30 min  Stress: No Stress Concern Present (10/03/2022)   [employer] of Occupational Health - Occupational Stress Questionnaire    Feeling of Stress : Not at all  Social Connections: Unknown (10/03/2022)   Social Connection and Isolation Panel    Frequency of Communication with Friends and Family: More than three times a week    Frequency of Social Gatherings with Friends and Family: Once a week    Attends Religious Services: Patient declined    Database Administrator or Organizations: Yes    Attends Banker Meetings: 1 to 4 times per year    Marital Status: Married  Catering Manager Violence: Not on file    Outpatient Medications Prior to Visit  Medication Sig Dispense Refill   acetaminophen  (TYLENOL ) 500 MG tablet Take 1,000 mg by mouth every 4 (four) hours as needed.     cetirizine (ZYRTEC) 10 MG tablet Take 10 mg by mouth daily  as needed for allergies or rhinitis.     Evolocumab  (REPATHA  SURECLICK) 140 MG/ML SOAJ Inject 140 mg into the skin every 14 (fourteen) days. 2 mL 0   hydrocortisone cream 1 % Apply 1 application topically daily as needed for itching.     insulin  glargine (LANTUS ) 100 UNIT/ML Solostar Pen Inject 10 Units into the skin at bedtime. Max daily dose: 20 units. Adjust your long-acting insulin  (glargine) based on your morning (fasting) blood sugar levels: -If your morning (fasting) blood sugar is consistently over 130 mg/dL, increase your glargine dose by 2 units every 3 days. If your morning (fasting) blood sugar drops below 80 mg/dL, decrease your dose by 2 units. Max daily dose: 20 units. 15 mL 11   metFORMIN  (GLUCOPHAGE ) 1000 MG tablet Take 1 tablet (1,000 mg total) by mouth 2 (two) times daily with a meal. 180 tablet 3   methocarbamol  (ROBAXIN ) 500 MG tablet Take 1 tablet (500 mg total) by mouth every 8 (eight) hours as needed for muscle spasms. 30 tablet 0   MOUNJARO  7.5 MG/0.5ML Pen INJECT 1 PEN SUBCUTANEOUSLY ONCE A WEEK 4 mL 0   No facility-administered medications prior to visit.    Allergies  Allergen Reactions   Aspirin Other (See Comments)    Stevens-Johnson Syndrome   Excedrin Migraine [Aspirin-Acetaminophen -Caffeine] Other (See Comments)    Stevens-Johnson Syndrome   Nsaids Other (See Comments)    Stevens-Johnson Syndrome   Statins     Skin sensitivity/ unable to tolerate   Tolmetin Other (See Comments)    Stevens-Johnson Syndrome    ROS     Objective:    Physical Exam   There were no vitals taken for this visit. Wt Readings from Last 3 Encounters:  12/05/23 212 lb 3.2 oz (96.3 kg)  10/24/23 215 lb 12.8 oz (97.9 kg)  10/22/23 211 lb 3.2 oz (95.8 kg)       Assessment & Plan:   Problem List Items Addressed This Visit   None   I am having Francisco Lawson maintain his cetirizine, hydrocortisone cream, methocarbamol , acetaminophen , Mounjaro , Repatha  SureClick,  metFORMIN , and insulin  glargine.  No orders of the defined types were placed in this encounter.

## 2024-01-09 ENCOUNTER — Other Ambulatory Visit: Payer: Self-pay

## 2024-01-09 ENCOUNTER — Other Ambulatory Visit: Payer: Self-pay | Admitting: Family

## 2024-01-09 MED ORDER — MOUNJARO 7.5 MG/0.5ML ~~LOC~~ SOAJ: 7.5000 mg | SUBCUTANEOUS | 0 refills | Status: DC

## 2024-01-17 ENCOUNTER — Ambulatory Visit: Payer: Self-pay

## 2024-01-17 NOTE — Progress Notes (Signed)
 Chief Complaint  Patient presents with   Cough    Onset cough for about 2 weeks Kids have the Flu   chest congestion    Taking Mucinex    Francisco Lawson here for URI complaints.  History of Present Illness Francisco Lawson is a 53 year old male with diabetes and pulmonary history who presents with ongoing cough and shortness of breath.  He has persistent cough and shortness of breath, worse in the mornings. He feels like he is panting at night, and a deep breath on waking triggers a cough that gives partial relief. He is scheduled for imaging later today and is using a nebulizer with 3% saline. Was seen by Pulmonology today. He is taking Tamiflu without prior flu testing. His daughter has influenza A and his son was treated with Tamiflu.  He has diabetes with improved control on metformin  1000 mg twice daily and Lantus  12 units at bedtime. Fasting blood sugars have improved from about 220 mg/dL to 899-875 mg/dL, and he checks them regularly.  He denies any current or past smoking.  Patient denies fever, chills, SOB, CP, palpitations, dyspnea, edema, HA, vision changes, N/V/D, abdominal pain, urinary symptoms, rash, weight changes, and recent illness or hospitalizations.   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    Objective BP 114/74 (BP Location: Left Arm, Patient Position: Sitting, Cuff Size: Large)   Pulse 81   Temp 97.9 F (36.6 C) (Oral)   Ht 6' 6 (1.981 m)   Wt 224 lb 3.2 oz (101.7 kg)   SpO2 95%   BMI 25.91 kg/m  General: Awake, alert, appears stated age HEENT: AT, [place], ears patent b/l and TM's neg, nares patent w/o discharge, pharynx pink and without exudates, MMM Neck: No masses or asymmetry Heart: RRR Lungs: CTAB, no accessory muscle use, +wheezing,  crackles RLL B, normal effort  Psych: Age appropriate judgment and insight, normal mood and affect  Exposure to the flu - Plan: POCT Influenza A/B, POC COVID-19 BinaxNow  Congestion of upper airway - Plan: POCT Influenza A/B, POC COVID-19 BinaxNow  Type 2 diabetes mellitus with other specified complication, without long-term current use of insulin  (HCC)  SOB (shortness of breath) - Plan: DG Chest 2 View, albuterol  (PROVENTIL ) (2.5 MG/3ML) 0.083% nebulizer solution 2.5 mg  Skin infection  Chest congestion FLU and COVID negative Ongoing cough and morning dyspnea.  Was seen by Pulmonology today and prescribed Tamiflu and doxycycline . Nebulizer with albuterol  prescribed. CT scan ordered by pulmonology, scheduled for later today. - Ordered chest x-ray if unable to complete CT scan. - Administered albuterol  nebulizer treatment in office. - Prescribed nebulizer with albuterol  for home use. - Ensure follow-up with pulmonologist.  DM Type 2 Reports fasting blood sugars 100-224 mg/dL. On Lantus  and metformin . Discussed dietary modifications and continuous glucose monitoring. Continue current medications. - Discussed continuous glucose monitoring as an option. - Emphasized dietary modifications to manage blood sugar. -FU 1 month recheck A1C and Liipid panel  SOB Following with pulmonology Albuterol  nebulizer given during OV   General health maintenance - Recommended flu vaccination and PNA vaccine series    Continue to push fluids, practice good hand hygiene, cover mouth when coughing. F/u prn. If starting to experience fevers, shaking,  or shortness of breath, seek immediate care. Pt voiced understanding and agreement to the plan.  Harlene LITTIE Jolly, DNP, AGNP-C 01/21/2024 3:05 PM

## 2024-01-17 NOTE — Telephone Encounter (Signed)
 FYI Only or Action Required?: FYI only for provider: appointment scheduled on 01/21/2024.  Patient was last seen in primary care on 12/05/2023 by Wheeler Harlene CROME, NP.  Called Nurse Triage reporting Cough.  Symptoms began several weeks ago.  Interventions attempted: Nothing.  Symptoms are: unchanged.  Triage Disposition: See PCP When Office is Open (Within 3 Days)  Patient/caregiver understands and will follow disposition?: Yes  Copied from CRM #[id number]. Topic: Clinical - Red Word Triage >> Jan 17, 2024 10:17 AM Dedra B wrote: [facility] that prompted transfer to Nurse Triage: Pt having chest congestion and coughing up dark brown mucous. Warm transfer to NT. Reason for Disposition  Cough has been present for > 3 weeks  Answer Assessment - Initial Assessment Questions 1. ONSET: When did the cough begin?      About two weeks ago 2. SEVERITY: How bad is the cough today?      moderate 3. SPUTUM: Describe the color of your sputum (e.g., none, dry cough; clear, white, yellow, green)     Brown mucus  4. HEMOPTYSIS: Are you coughing up any blood? If Yes, ask: How much? (e.g., flecks, streaks, tablespoons, etc.)     denies 5. DIFFICULTY BREATHING: Are you having difficulty breathing? If Yes, ask: How bad is it? (e.g., mild, moderate, severe)      States that in the morning, he feels like he has been holding his breath.  He coughs and clears.  But has no SOB during the day 6. FEVER: Do you have a fever? If Yes, ask: What is your temperature, how was it measured, and when did it start?     denies 7. CARDIAC HISTORY: Do you have any history of heart disease? (e.g., heart attack, congestive heart failure)      denies 8. LUNG HISTORY: Do you have any history of lung disease?  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 OTHER SYMPTOMS: Do you have any other symptoms? (e.g., runny nose, wheezing, chest pain)        Tightness in chest occasionally, wheezing daily, runny nose  12. TRAVEL: Have you traveled out of the country in the last month? (e.g., travel history, exposures)       denies  Protocols used: Cough - Acute Productive-A-AH

## 2024-01-17 NOTE — Telephone Encounter (Signed)
 Appt scheduled

## 2024-01-21 ENCOUNTER — Other Ambulatory Visit (HOSPITAL_BASED_OUTPATIENT_CLINIC_OR_DEPARTMENT_OTHER): Payer: Self-pay

## 2024-01-21 ENCOUNTER — Ambulatory Visit: Admitting: Student

## 2024-01-21 ENCOUNTER — Encounter: Payer: Self-pay | Admitting: Student

## 2024-01-21 VITALS — BP 114/74 | HR 81 | Temp 97.9°F | Ht 78.0 in | Wt 224.2 lb

## 2024-01-21 DIAGNOSIS — R0602 Shortness of breath: Secondary | ICD-10-CM | POA: Diagnosis not present

## 2024-01-21 DIAGNOSIS — E1169 Type 2 diabetes mellitus with other specified complication: Secondary | ICD-10-CM

## 2024-01-21 DIAGNOSIS — L089 Local infection of the skin and subcutaneous tissue, unspecified: Secondary | ICD-10-CM

## 2024-01-21 DIAGNOSIS — J988 Other specified respiratory disorders: Secondary | ICD-10-CM

## 2024-01-21 DIAGNOSIS — Z20828 Contact with and (suspected) exposure to other viral communicable diseases: Secondary | ICD-10-CM

## 2024-01-21 DIAGNOSIS — Z22322 Carrier or suspected carrier of Methicillin resistant Staphylococcus aureus: Secondary | ICD-10-CM | POA: Diagnosis not present

## 2024-01-21 DIAGNOSIS — Z7984 Long term (current) use of oral hypoglycemic drugs: Secondary | ICD-10-CM

## 2024-01-21 DIAGNOSIS — J471 Bronchiectasis with (acute) exacerbation: Secondary | ICD-10-CM | POA: Diagnosis not present

## 2024-01-21 DIAGNOSIS — J101 Influenza due to other identified influenza virus with other respiratory manifestations: Secondary | ICD-10-CM | POA: Diagnosis not present

## 2024-01-21 LAB — POC COVID19 BINAXNOW: SARS Coronavirus 2 Ag: NEGATIVE

## 2024-01-21 LAB — POCT INFLUENZA A/B
Influenza A, POC: NEGATIVE
Influenza B, POC: NEGATIVE

## 2024-01-21 MED ORDER — ALBUTEROL SULFATE HFA 108 (90 BASE) MCG/ACT IN AERS
2.0000 | INHALATION_SPRAY | Freq: Four times a day (QID) | RESPIRATORY_TRACT | 2 refills | Status: AC | PRN
  Filled 2024-01-21: qty 6.7, 30d supply, fill #0

## 2024-01-21 MED ORDER — ALBUTEROL SULFATE (2.5 MG/3ML) 0.083% IN NEBU
2.5000 mg | INHALATION_SOLUTION | Freq: Once | RESPIRATORY_TRACT | Status: AC
  Administered 2024-01-21: 15:00:00 2.5 mg via RESPIRATORY_TRACT

## 2024-01-21 NOTE — Assessment & Plan Note (Signed)
 Reports fasting blood sugars 100-224 mg/dL. On Lantus  and metformin . Discussed dietary modifications and continuous glucose monitoring. Continue current medications. - Fu in one month to recheck A1c and lipids. - Discussed continuous glucose monitoring as an option. - Emphasized dietary modifications to manage blood sugar.

## 2024-01-23 DIAGNOSIS — Z22322 Carrier or suspected carrier of Methicillin resistant Staphylococcus aureus: Secondary | ICD-10-CM | POA: Diagnosis not present

## 2024-01-23 DIAGNOSIS — J471 Bronchiectasis with (acute) exacerbation: Secondary | ICD-10-CM | POA: Diagnosis not present

## 2024-01-31 ENCOUNTER — Other Ambulatory Visit (HOSPITAL_BASED_OUTPATIENT_CLINIC_OR_DEPARTMENT_OTHER): Payer: Self-pay

## 2024-02-01 ENCOUNTER — Other Ambulatory Visit: Payer: Self-pay | Admitting: Student

## 2024-02-03 ENCOUNTER — Other Ambulatory Visit: Payer: Self-pay | Admitting: Student

## 2024-02-11 ENCOUNTER — Ambulatory Visit: Admitting: Family

## 2024-02-11 ENCOUNTER — Other Ambulatory Visit (HOSPITAL_BASED_OUTPATIENT_CLINIC_OR_DEPARTMENT_OTHER): Payer: Self-pay

## 2024-02-11 ENCOUNTER — Ambulatory Visit: Payer: Self-pay

## 2024-02-11 VITALS — BP 111/66 | HR 96 | Temp 98.1°F | Resp 16 | Ht 78.0 in | Wt 224.0 lb

## 2024-02-11 DIAGNOSIS — B029 Zoster without complications: Secondary | ICD-10-CM | POA: Diagnosis not present

## 2024-02-11 MED ORDER — VALACYCLOVIR HCL 1 G PO TABS
1000.0000 mg | ORAL_TABLET | Freq: Three times a day (TID) | ORAL | 0 refills | Status: AC
  Filled 2024-02-11: qty 21, 7d supply, fill #0

## 2024-02-11 NOTE — Telephone Encounter (Signed)
" °  Rt shoulder   Copied from CRM [id number]. Topic: Clinical - Red Word Triage >> Feb 11, 2024  8:21 AM Berneda FALCON wrote: Red Word that prompted transfer to Nurse Triage: Patient wants appt today for possible shingles. Area under right shoulder blade that has raised bumps that are very painful. When he was explaining to dermatologist what it was, they referred him to PCP for possible shingles. Noticed a couple of days ago. Reason for Disposition  [1] Localized rash is very painful AND [2] no fever  [1] Shingles rash (matches SYMPTOMS) AND [2] onset < 72 hours ago (3 days)  Answer Assessment - Initial Assessment Questions 1. APPEARANCE of RASH: What does the rash look like? (e.g., blisters, dry flaky skin, red spots, redness, sores)     Few days ago 2. LOCATION: Where is the rash located?      Rt shoulder 3. NUMBER: How many spots are there?      Strip of red bumps,  4. SIZE: How big are the spots? (e.g., inches, cm; or compare to size of pinhead, tip of pen, eraser, pea)      *No Answer* 5. ONSET: When did the rash start?      Few days ago 6. ITCHING: Does the rash itch? If Yes, ask: How bad is the itch?  (Scale 0-10; or none, mild, moderate, severe)     no 7. PAIN: Does the rash hurt? If Yes, ask: How bad is the pain?  (Scale 0-10; or none, mild, moderate, severe)     moderate 8. OTHER SYMPTOMS: Do you have any other symptoms? (e.g., fever)     *No Answer* 9. PREGNANCY: Is there any chance you are pregnant? When was your last menstrual period?     *No Answer*  Answer Assessment - Initial Assessment Questions 1. APPEARANCE of RASH: What does the rash look like?      Red bumps in a strip  2. LOCATION: Where is the rash located?      Back of rt shoulder 3. ONSET: When did the rash start?      3 days ago 4. ITCHING: Does the rash itch? If Yes, ask: How bad is the itch?  (Scale 1-10; or mild, moderate, severe)     no 5. PAIN: Does the rash hurt? If  Yes, ask: How bad is the pain?  (Scale 0-10; or none, mild, moderate, severe)     Mild pain 6. OTHER SYMPTOMS: Do you have any other symptoms? (e.g., fever)     Denies fever, spreading redness, drainage  Protocols used: Rash or Redness - Localized-A-AH, Shingles (Zoster)-A-AH  "

## 2024-02-11 NOTE — Telephone Encounter (Signed)
"  Appt scheduled  "

## 2024-02-11 NOTE — Assessment & Plan Note (Signed)
" °  Acute herpes zoster on the right back, consistent with shingles. Explained reactivation of varicella-zoster virus, typically unilateral. Discussed symptom resolution timeline and recurrence risk. Explained vaccination reduces future risk by 90%. - Prescribed antiviral medication to reduce duration and severity. - Recommended Tylenol  or Motrin for pain management as needed. - Advised over-the-counter lidocaine  patches for topical relief if pain worsens. - Recommended shingles vaccination six months post-episode. "

## 2024-02-11 NOTE — Progress Notes (Signed)
 "  Subjective:     Patient ID: Francisco Lawson, male    DOB: 1970-07-29, 54 y.o.   MRN: [id number]  Chief Complaint  Patient presents with   Rash    Patient reports having a painful rash on his back    Rash    Discussed the use of AI scribe software for clinical note transcription with the patient, who gave verbal consent to proceed.  History of Present Illness Francisco Lawson is a 54 year old male who presents with a rash on the right side of his back.  He noticed the onset of a rash on the right side of his back a couple of days ago, which was first identified after experiencing pain when water hit his back during a shower. The rash is painful, especially when sitting back, but does not interfere with his sleep. He reports not noticing any new rash on the front of his body, but mentions soreness and a lump in the area.  He has not received the shingles vaccine previously. 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 He has not received a flu shot this year.  He is married and has multiple children, including a one-year-old.      Health Maintenance Due  Topic Date Due   Medicare Annual Wellness (AWV)  Never done   FOOT EXAM  Never done   Diabetic kidney evaluation - Urine ACR  Never done   Pneumococcal Vaccine: 50+ Years (1 of 2 - PCV) Never done   Hepatitis B Vaccines 19-59 Average Risk (1 of 3 - 19+ 3-dose series) Never done   Zoster Vaccines- Shingrix (1 of 2) Never done   COVID-19 Vaccine (5 - 2025-26 season) 10/07/2023    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    Past Surgical History:  Procedure Laterality Date   Biapical lung resection  2010   CHEST TUBE INSERTION      KNEE ARTHROSCOPY Right    LYMPH GLAND EXCISION     SPINE SURGERY      Family History  Problem Relation Age of Onset   Heart disease Father    Early death Father    Hypertension Father    Hyperlipidemia Father    Heart attack Father    Sudden death Father    Heart disease Mother    Hypertension Mother    Cancer Mother    Arthritis Mother     Social History   Socioeconomic History   Marital status: Married    Spouse name: Not on file   Number of children: Not on file   Years of education: Not on file   Highest education level: Some college, no degree  Occupational History   Not on file  Tobacco Use   Smoking status: Never   Smokeless tobacco: Never  Substance and Sexual Activity   Alcohol use: Yes    Comment: RARE   Drug use: No   Sexual activity: Not on file  Other Topics Concern   Not on file  Social History Narrative   Not on file   Social Drivers of Health   Tobacco  Use: Low Risk (01/21/2024)   Patient History    Smoking Tobacco Use: Never    Smokeless Tobacco Use: Never    Passive Exposure: Not on file  Financial Resource Strain: Low Risk (01/20/2024)   Overall Financial Resource Strain (CARDIA)    Difficulty of Paying Living Expenses: Not hard at all  Food Insecurity: Food Insecurity Present (01/20/2024)   Epic    Worried About Programme Researcher, Broadcasting/film/video in the Last Year: Never true    Ran Out of Food in the Last Year: Sometimes true  Transportation Needs: No Transportation Needs (01/20/2024)   Epic    Lack of Transportation (Medical): No    Lack of Transportation (Non-Medical): No  Physical Activity: Sufficiently Active (01/20/2024)   Exercise Vital Sign    Days of Exercise per Week: 7 days    Minutes of Exercise per Session: 30 min  Stress: No Stress Concern Present (01/20/2024)   [employer] of Occupational Health - Occupational Stress Questionnaire    Feeling of Stress: Only a little  Social Connections: Moderately Integrated (01/20/2024)    Social Connection and Isolation Panel    Frequency of Communication with Friends and Family: More than three times a week    Frequency of Social Gatherings with Friends and Family: Once a week    Attends Religious Services: Never    Database Administrator or Organizations: Yes    Attends Banker Meetings: 1 to 4 times per year    Marital Status: Married  Catering Manager Violence: Not on file  Depression (PHQ2-9): Low Risk (01/21/2024)   Depression (PHQ2-9)    PHQ-2 Score: 0  Alcohol Screen: Low Risk (01/20/2024)   Alcohol Screen    Last Alcohol Screening Score (AUDIT): 0  Housing: Low Risk (01/20/2024)   Epic    Unable to Pay for Housing in the Last Year: No    Number of Times Moved in the Last Year: 0    Homeless in the Last Year: No  Utilities: Low Risk (11/22/2022)   Received from [facility]   Utilities    In the past 12 months has the electric, gas, oil, or water company threatened to shut off services in your home? : No  Health Literacy: Not on file    Outpatient Medications Prior to Visit  Medication Sig Dispense Refill   acetaminophen  (TYLENOL ) 500 MG tablet Take 1,000 mg by mouth every 4 (four) hours as needed.     albuterol  (VENTOLIN  HFA) 108 (90 Base) MCG/ACT inhaler Inhale 2 puffs into the lungs every 6 (six) hours as needed for wheezing or shortness of breath. 6.7 g 2   cetirizine (ZYRTEC) 10 MG tablet Take 10 mg by mouth daily as needed for allergies or rhinitis.     Evolocumab  (REPATHA  SURECLICK) 140 MG/ML SOAJ Inject 140 mg into the skin every 14 (fourteen) days. 2 mL 2   hydrocortisone cream 1 % Apply 1 application topically daily as needed for itching.     insulin  glargine (LANTUS ) 100 UNIT/ML Solostar Pen Inject 10 Units into the skin at bedtime. Max daily dose: 20 units. Adjust your long-acting insulin  (glargine) based on your morning (fasting) blood sugar levels: -If your morning (fasting) blood sugar is consistently over 130 mg/dL, increase  your glargine dose by 2 units every 3 days. If your morning (fasting) blood sugar drops below 80 mg/dL, decrease your dose by 2 units. Max daily dose: 20 units. 15 mL 11   metFORMIN  (GLUCOPHAGE ) 1000 MG tablet  Take 1 tablet (1,000 mg total) by mouth 2 (two) times daily with a meal. 180 tablet 3   methocarbamol  (ROBAXIN ) 500 MG tablet Take 1 tablet (500 mg total) by mouth every 8 (eight) hours as needed for muscle spasms. 30 tablet 0   No facility-administered medications prior to visit.    Allergies[1]  Review of Systems  Skin:  Positive for rash.       Objective:    Physical Exam Constitutional:      Appearance: Normal appearance.  Cardiovascular:     Rate and Rhythm: Normal rate.  Skin:    Comments: Raised papular rash noted right posterior mid-back  Neurological:     Mental Status: He is alert.      BP 111/66 (BP Location: Right Arm, Patient Position: Sitting, Cuff Size: Large)   Pulse 96   Temp 98.1 F (36.7 C) (Oral)   Resp 16   Ht 6' 6 (1.981 m)   Wt 224 lb (101.6 kg)   SpO2 97%   BMI 25.89 kg/m  Wt Readings from Last 3 Encounters:  02/11/24 224 lb (101.6 kg)  01/21/24 224 lb 3.2 oz (101.7 kg)  12/05/23 212 lb 3.2 oz (96.3 kg)        Assessment & Plan:   Problem List Items Addressed This Visit   None Visit Diagnoses       Herpes zoster without complication    -  Primary   Relevant Medications   valACYclovir  (VALTREX ) 1000 MG tablet     Assessment and Plan Assessment & Plan   General Health Maintenance Discussed importance of vaccinations, including shingles, pneumonia, and flu vaccines. Advised flu and pneumonia vaccines due to lung damage in the lower right lobe. - Recommended flu vaccination. - Recommended pneumonia vaccination.    I am having Francisco Lawson start on valACYclovir . I am also having him maintain his cetirizine, hydrocortisone cream, methocarbamol , acetaminophen , metFORMIN , insulin  glargine, Repatha  SureClick, and  albuterol .  Meds ordered this encounter  Medications   valACYclovir  (VALTREX ) 1000 MG tablet    Sig: Take 1 tablet (1,000 mg total) by mouth 3 (three) times daily.    Dispense:  21 tablet    Refill:  0    Supervising Provider:   DOMENICA BLACKBIRD A [[id number]]      [1]  Allergies Allergen Reactions   Aspirin Other (See Comments)    Stevens-Johnson Syndrome   Excedrin Migraine [Aspirin-Acetaminophen -Caffeine] Other (See Comments)    Stevens-Johnson Syndrome   Nsaids Other (See Comments)    Stevens-Johnson Syndrome   Statins     Skin sensitivity/ unable to tolerate   Tolmetin Other (See Comments)    Stevens-Johnson Syndrome   "

## 2024-02-11 NOTE — Patient Instructions (Signed)
" °  VISIT SUMMARY: Today, you were seen for a painful rash on the right side of your back, which was diagnosed as shingles. We discussed the cause, treatment, and prevention of shingles, as well as the importance of vaccinations for your overall health.  YOUR PLAN: -HERPES ZOSTER (SHINGLES): Shingles is a reactivation of the chickenpox virus, causing a painful rash that typically appears on one side of the body. You have been prescribed antiviral medication to reduce the duration and severity of the rash. For pain management, you can take Tylenol  or Motrin as needed, and use over-the-counter lidocaine  patches if the pain worsens. We also recommend getting the shingles vaccine six months after this episode to reduce the risk of future occurrences by 90%.  -GENERAL HEALTH MAINTENANCE: We discussed the importance of staying up-to-date with your vaccinations, especially given your lung condition. It is recommended that you receive the flu and pneumonia vaccines to help protect your health.  INSTRUCTIONS: Please follow up with your pulmonologist regarding the pneumonia vaccine. Schedule an appointment to receive the flu vaccine as soon as possible. Plan to get the shingles vaccine six months after your current episode of shingles has resolved.                   "

## 2024-02-21 ENCOUNTER — Ambulatory Visit: Payer: Self-pay | Admitting: Student

## 2024-02-21 ENCOUNTER — Ambulatory Visit: Admitting: Student

## 2024-02-21 ENCOUNTER — Encounter: Payer: Self-pay | Admitting: Student

## 2024-02-21 VITALS — BP 118/76 | HR 84 | Temp 97.7°F | Ht 78.0 in | Wt 225.4 lb

## 2024-02-21 DIAGNOSIS — J470 Bronchiectasis with acute lower respiratory infection: Secondary | ICD-10-CM

## 2024-02-21 DIAGNOSIS — E785 Hyperlipidemia, unspecified: Secondary | ICD-10-CM | POA: Diagnosis not present

## 2024-02-21 DIAGNOSIS — B029 Zoster without complications: Secondary | ICD-10-CM | POA: Diagnosis not present

## 2024-02-21 DIAGNOSIS — J45909 Unspecified asthma, uncomplicated: Secondary | ICD-10-CM | POA: Insufficient documentation

## 2024-02-21 DIAGNOSIS — E1169 Type 2 diabetes mellitus with other specified complication: Secondary | ICD-10-CM

## 2024-02-21 DIAGNOSIS — Z7984 Long term (current) use of oral hypoglycemic drugs: Secondary | ICD-10-CM | POA: Diagnosis not present

## 2024-02-21 LAB — MICROALBUMIN / CREATININE URINE RATIO
Creatinine,U: 130.5 mg/dL
Microalb Creat Ratio: UNDETERMINED mg/g (ref 0.0–30.0)
Microalb, Ur: <0.7 mg/dL

## 2024-02-21 LAB — HEMOGLOBIN A1C: Hgb A1c MFr Bld: 7.8 % — ABNORMAL HIGH (ref 4.6–6.5)

## 2024-02-21 MED ORDER — METFORMIN HCL 1000 MG PO TABS: 1000.0000 mg | ORAL_TABLET | Freq: Two times a day (BID) | ORAL | 2 refills | Status: AC

## 2024-02-21 MED ORDER — REPATHA SURECLICK 140 MG/ML ~~LOC~~ SOAJ: 140.0000 mg | SUBCUTANEOUS | 2 refills | Status: AC

## 2024-02-21 MED ORDER — FREESTYLE LIBRE 3 PLUS SENSOR MISC: 3 refills | Status: AC

## 2024-02-21 NOTE — Assessment & Plan Note (Addendum)
 On Repatha  injections. Continue Same. Encourage heart healthy diet such as MIND or DASH diet, increase exercise, avoid trans fats, simple carbohydrates and processed foods, consider a krill or fish or flaxseed oil cap daily.

## 2024-02-21 NOTE — Assessment & Plan Note (Addendum)
 On Lantus  and Metformin . Continue Same A1C goal <7% Update A1C today FU 3 months

## 2024-02-21 NOTE — Assessment & Plan Note (Signed)
 Stable. Following with Pulmonology.

## 2024-02-21 NOTE — Progress Notes (Addendum)
 "  Subjective:     Patient ID: Francisco Lawson, male    DOB: [birth year], 54 y.o.   MRN: [id number]  No chief complaint on file.   HPI  Discussed the use of AI scribe software for clinical note transcription with the patient, who gave verbal consent to proceed.  History of Present Illness Francisco Lawson is a 54 year old male with diabetes who presents for follow up.  Follows with: Pulmonology  He uses Repatha  injections every 2 weeks for cholesterol management after prior statin sensitivity.  He recently had shingles that are healing but still itchy and is taking valacyclovir . Denies pain.   Diabetes: - Checking glucose at home: Yes -Home BS 90s in morning - Medications: Lantus  14 units, Metformin  - Compliant with medications - Denies symptoms of hypoglycemia, polyuria, polydipsia, numbness extremities, foot ulcers/trauma, visual changes, wounds that are not healing, medication side effects   Patient denies fever, chills, SOB, CP, palpitations, dyspnea, edema, HA, vision changes, N/V/D, abdominal pain, urinary symptoms, rash, weight changes, and recent illness or hospitalizations.    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    Past Surgical History:  Procedure Laterality Date   ANTERIOR CERVICAL DISCECTOMY & FUSION With PLATES Cervical 6/7   05/2021   Surgeon: Almarie Jae Salaam, MD;  Location: [facility] MAIN OR; Service: Orthopedics;   Biapical lung resection  2010   CHEST TUBE INSERTION     HAND SURGERY Right  Right 01/23/2017   Procedure: HAND SURGERY; stenosying tenosynovitis   KNEE ARTHROSCOPY Right    LUNG SURGERY; for pneumothorax Bilateral 1989   Both lungs collapsed, started at age 45, in hospital for 2 years   LYMPH GLAND EXCISION     SPINE SURGERY      Family History  Problem Relation Age of Onset   Heart disease Father    Early death Father    Hypertension Father    Hyperlipidemia Father    Heart attack Father    Sudden death Father    Heart disease Mother    Hypertension Mother    Cancer Mother    Arthritis Mother     Social History   Socioeconomic History   Marital status: Married    Spouse name: Not on file   Number of children: Not on file   Years of education: Not on file   Highest education level: Some college, no degree  Occupational History   Not on file  Tobacco Use   Smoking status: Never   Smokeless tobacco: Never  Substance and Sexual Activity   Alcohol use: Yes    Comment: RARE   Drug use: No   Sexual activity: Not on file  Other Topics Concern   Not on file  Social History Narrative   Not  on file   Social Drivers of Health   Tobacco Use: Low Risk (02/21/2024)   Patient History    Smoking Tobacco Use: Never    Smokeless Tobacco Use: Never    Passive Exposure: Not on file  Financial Resource Strain: Low Risk (01/20/2024)   Overall Financial Resource Strain (CARDIA)    Difficulty of Paying Living Expenses: Not hard at all  Food Insecurity: Food Insecurity Present (01/20/2024)   Epic    Worried About Programme Researcher, Broadcasting/film/video in the Last Year: Never true    Ran Out of Food in the Last Year: Sometimes true  Transportation Needs: No Transportation Needs (01/20/2024)   Epic    Lack of Transportation (Medical): No    Lack of Transportation (Non-Medical): No  Physical Activity: Sufficiently Active (01/20/2024)   Exercise  Vital Sign    Days of Exercise per Week: 7 days    Minutes of Exercise per Session: 30 min  Stress: No Stress Concern Present (01/20/2024)   [employer] of Occupational Health - Occupational Stress Questionnaire    Feeling of Stress: Only a little  Social Connections: Moderately Integrated (01/20/2024)   Social Connection and Isolation Panel    Frequency of Communication with Friends and Family: More than three times a week    Frequency of Social Gatherings with Friends and Family: Once a week    Attends Religious Services: Never    Database Administrator or Organizations: Yes    Attends Banker Meetings: 1 to 4 times per year    Marital Status: Married  Catering Manager Violence: Not on file  Depression (PHQ2-9): Low Risk (02/21/2024)   Depression (PHQ2-9)    PHQ-2 Score: 0  Alcohol Screen: Low Risk (01/20/2024)   Alcohol Screen    Last Alcohol Screening Score (AUDIT): 0  Housing: Low Risk (01/20/2024)   Epic    Unable to Pay for Housing in the Last Year: No    Number of Times Moved in the Last Year: 0    Homeless in the Last Year: No  Utilities: Low Risk (11/22/2022)   Received from [facility]   Utilities    In the past 12 months has the electric, gas, oil, or water company threatened to shut off services in your home? : No  Health Literacy: Not on file    Outpatient Medications Prior to Visit  Medication Sig Dispense Refill   acetaminophen  (TYLENOL ) 500 MG tablet Take 1,000 mg by mouth every 4 (four) hours as needed.     albuterol  (VENTOLIN  HFA) 108 (90 Base) MCG/ACT inhaler Inhale 2 puffs into the lungs every 6 (six) hours as needed for wheezing or shortness of breath. 6.7 g 2   cetirizine (ZYRTEC) 10 MG tablet Take 10 mg by mouth daily as needed for allergies or rhinitis.     hydrocortisone cream 1 % Apply 1 application topically daily as needed for itching.     insulin  glargine (LANTUS ) 100 UNIT/ML Solostar Pen Inject 10 Units into the skin at  bedtime. Max daily dose: 20 units. Adjust your long-acting insulin  (glargine) based on your morning (fasting) blood sugar levels: -If your morning (fasting) blood sugar is consistently over 130 mg/dL, increase your glargine dose by 2 units every 3 days. If your morning (fasting) blood sugar drops below 80 mg/dL, decrease your dose by 2 units. Max daily dose: 20 units. 15 mL 11   valACYclovir  (VALTREX ) 1000 MG tablet Take 1 tablet (1,000 mg total) by mouth 3 (three)  times daily. 21 tablet 0   Evolocumab  (REPATHA  SURECLICK) 140 MG/ML SOAJ Inject 140 mg into the skin every 14 (fourteen) days. 2 mL 2   metFORMIN  (GLUCOPHAGE ) 1000 MG tablet Take 1 tablet (1,000 mg total) by mouth 2 (two) times daily with a meal. 180 tablet 3   methocarbamol  (ROBAXIN ) 500 MG tablet Take 1 tablet (500 mg total) by mouth every 8 (eight) hours as needed for muscle spasms. 30 tablet 0   No facility-administered medications prior to visit.    Allergies[1]  ROS    See HPI Objective:    Physical Exam Vitals reviewed.  Constitutional:      General: He is not in acute distress.    Appearance: He is not toxic-appearing.  HENT:     Head: Normocephalic and atraumatic.     Mouth/Throat:     Mouth: Mucous membranes are moist.     Pharynx: Oropharynx is clear.  Eyes:     Pupils: Pupils are equal, round, and reactive to light.  Cardiovascular:     Rate and Rhythm: Normal rate and regular rhythm.     Pulses: Normal pulses.     Heart sounds: Normal heart sounds. No murmur heard. Pulmonary:     Effort: Pulmonary effort is normal. No respiratory distress.     Breath sounds: Wheezing present.     Comments: Wheezing, crackles RLL B, normal effort Musculoskeletal:        General: No swelling.     Cervical back: Neck supple.  Skin:    General: Skin is warm and dry.  Neurological:     General: No focal deficit present.     Mental Status: He is alert and oriented to person, place, and time.  Psychiatric:        Mood and  Affect: Mood normal.        Behavior: Behavior normal.        Thought Content: Thought content normal.        Judgment: Judgment normal.      BP 118/76   Pulse 84   Temp 97.7 F (36.5 C)   Ht 6' 6 (1.981 m)   Wt 225 lb 6.4 oz (102.2 kg)   SpO2 96%   BMI 26.05 kg/m  Wt Readings from Last 3 Encounters:  02/21/24 225 lb 6.4 oz (102.2 kg)  02/11/24 224 lb (101.6 kg)  01/21/24 224 lb 3.2 oz (101.7 kg)       Assessment & Plan:   Problem List Items Addressed This Visit       Respiratory   Bronchiectasis with acute lower respiratory infection [facility])   Following with pulmonology. Known Staphylococcus aureus colonization. Pt to start Brensocatib, continue FU with Pulm.         Endocrine   Diabetes mellitus (HCC) - Primary   On Lantus  and Metformin . Continue Same A1C goal <7% Update A1C today FU 3 months      Relevant Medications   Continuous Glucose Sensor (FREESTYLE LIBRE 3 PLUS SENSOR) MISC   metFORMIN  (GLUCOPHAGE ) 1000 MG tablet   Other Relevant Orders   Urine Albumin/Creatinine with ratio (send out) [LAB689]   HgB A1c (Completed)   AMB Referral VBCI Care Management     Nervous and Auditory   Herpes zoster without complication   Healing well, denies pain. -Recommended Tylenol  or Motrin for pain management as needed. - Advised over-the-counter lidocaine  patches for topical relief if pain worsens. - Recommended shingles vaccination six months post-episode.  Other   Hyperlipidemia   On Repatha  injections. Continue Same. Encourage heart healthy diet such as MIND or DASH diet, increase exercise, avoid trans fats, simple carbohydrates and processed foods, consider a krill or fish or flaxseed oil cap daily.         Relevant Medications   Evolocumab  (REPATHA  SURECLICK) 140 MG/ML SOAJ    Type 2 diabetes mellitus Managed with Lantus  and metformin .Discussed Freestyle Libre for glucose management. - Set up Freestyle Libre continuous glucose monitor with  pharmacist  - Refilled metformin  prescription.  FU 3  months     I have discontinued Francisco L. Westfall's methocarbamol . I am also having him start on FreeStyle Libre 3 Plus Sensor. Additionally, I am having him maintain his cetirizine, hydrocortisone cream, acetaminophen , insulin  glargine, albuterol , valACYclovir , metFORMIN , and Repatha  SureClick.  Meds ordered this encounter  Medications   Continuous Glucose Sensor (FREESTYLE LIBRE 3 PLUS SENSOR) MISC    Sig: Change sensor every 15 days.    Dispense:  6 each    Refill:  3    Supervising Provider:   DOMENICA BLACKBIRD A [[id number]]   metFORMIN  (GLUCOPHAGE ) 1000 MG tablet    Sig: Take 1 tablet (1,000 mg total) by mouth 2 (two) times daily with a meal.    Dispense:  180 tablet    Refill:  2    Supervising Provider:   DOMENICA BLACKBIRD A [[id number]]   Evolocumab  (REPATHA  SURECLICK) 140 MG/ML SOAJ    Sig: Inject 140 mg into the skin every 14 (fourteen) days.    Dispense:  2 mL    Refill:  2    Supervising Provider:   DOMENICA BLACKBIRD A [[id number]]      [1]  Allergies Allergen Reactions   Aspirin Other (See Comments)    Stevens-Johnson Syndrome   Excedrin Migraine [Aspirin-Acetaminophen -Caffeine] Other (See Comments)    Stevens-Johnson Syndrome   Nsaids Other (See Comments)    Stevens-Johnson Syndrome   Statins     Skin sensitivity/ unable to tolerate   Tolmetin Other (See Comments)    Stevens-Johnson Syndrome   "

## 2024-02-21 NOTE — Assessment & Plan Note (Signed)
 Following with pulmonology. Known Staphylococcus aureus colonization. Pt to start Brensocatib, continue FU with Pulm.

## 2024-02-21 NOTE — Assessment & Plan Note (Signed)
 Healing well, denies pain. -Recommended Tylenol  or Motrin for pain management as needed. - Advised over-the-counter lidocaine  patches for topical relief if pain worsens. - Recommended shingles vaccination six months post-episode.

## 2024-02-23 ENCOUNTER — Encounter: Payer: Self-pay | Admitting: Student

## 2024-02-23 MED ORDER — TIRZEPATIDE 2.5 MG/0.5ML ~~LOC~~ SOAJ: 2.5000 mg | SUBCUTANEOUS | 3 refills | Status: AC

## 2024-02-26 ENCOUNTER — Ambulatory Visit: Admitting: Student

## 2024-02-26 ENCOUNTER — Encounter: Payer: Self-pay | Admitting: Student

## 2024-02-26 ENCOUNTER — Telehealth: Payer: Self-pay

## 2024-02-26 ENCOUNTER — Ambulatory Visit: Payer: Self-pay

## 2024-02-26 VITALS — BP 122/82 | HR 96 | Temp 97.7°F | Resp 14 | Ht 79.0 in | Wt 227.4 lb

## 2024-02-26 DIAGNOSIS — J029 Acute pharyngitis, unspecified: Secondary | ICD-10-CM | POA: Insufficient documentation

## 2024-02-26 DIAGNOSIS — J069 Acute upper respiratory infection, unspecified: Secondary | ICD-10-CM | POA: Diagnosis not present

## 2024-02-26 LAB — POCT RAPID STREP A (OFFICE): Rapid Strep A Screen: NEGATIVE

## 2024-02-26 LAB — POC COVID19/FLU A&B COMBO
Covid Antigen, POC: NEGATIVE
Influenza A Antigen, POC: NEGATIVE
Influenza B Antigen, POC: NEGATIVE

## 2024-02-26 MED ORDER — FLUTICASONE PROPIONATE 50 MCG/ACT NA SUSP: 2.0000 | Freq: Every day | NASAL | 6 refills | Status: AC

## 2024-02-26 NOTE — Progress Notes (Signed)
 Complex Care Management Note  Care Guide Note 02/26/2024 Name: Francisco Lawson MRN: [id number] DOB: 1970/02/17  [name] is a 54 y.o. year old male who sees Wheeler Harlene LITTIE, NP for primary care. I reached out to [name] by phone today to offer complex care management services.  [name] was given information about Complex Care Management services today including:   The Complex Care Management services include support from the care team which includes your Nurse Care Manager, Clinical Social Worker, or Pharmacist.  The Complex Care Management team is here to help remove barriers to the health concerns and goals most important to you. Complex Care Management services are voluntary, and the patient may decline or stop services at any time by request to their care team member.   Complex Care Management Consent Status: Patient did not agree to participate in complex care management services at this time.  Follow up plan:  Patient will follow up with PCP as needed.  Dreama [facility]wood [facility]  [facility], [facility] VBCI Assistant Direct Dial: [phone]  Fax: [phone]

## 2024-02-26 NOTE — Telephone Encounter (Signed)
 FYI Only or Action Required?: FYI only for provider: appointment scheduled on 1/21.  Patient was last seen in primary care on 02/21/2024 by Francisco Harlene CROME, NP.  Called Nurse Triage reporting Sore Throat.  Symptoms began yesterday.  Interventions attempted: Rest, hydration, or home remedies.  Symptoms are: gradually worsening.  Triage Disposition: See Physician Within 24 Hours  Patient/caregiver understands and will follow disposition?: Yes  Message from [facility] sent at 02/26/2024 10:48 AM EST  Reason for Triage: Possible strep, throat swollen,can barely swallow and hurts to swallow, feels like larynx is laying on tongue    Reason for Disposition  SEVERE throat pain (e.g., excruciating)  Answer Assessment - Initial Assessment Questions Monday- woke with a sore in the morning resolved as the day progressed Tuesday- same thing happened-sore AM- resolved PM Weds-- woke with throat burning. Feels like his larynx is resting on his tongue and interfering with his speech. Burning pain with swallowing. No visualized white patched. Back of throat red and swollen.   Denies CP, Fever, SOB, Dizziness. Has some right nare/sinus congestion that will drain occasionally.   Appt with APP this afternoon. Patient understands ED/UC precautions  1. ONSET: When did the throat start hurting? (Hours or days ago)      Monday scratchy 2. SEVERITY: How bad is the sore throat? (Scale 1-10; mild, moderate or severe)     10/10 pain  3. STREP EXPOSURE: Has there been any exposure to strep within the past week? If Yes, ask: What type of contact occurred?      Possibly from kids daycare 4.  VIRAL SYMPTOMS: Are there any symptoms of a cold, such as a runny nose, cough, hoarse voice or red eyes?      Slight sinus congestion 5. FEVER: Do you have a fever? If Yes, ask: What is your temperature, how was it measured, and when did it start?     Denies  6. PUS ON THE TONSILS: Is there pus on  the tonsils in the back of your throat?     Just redness 7. OTHER SYMPTOMS: Do you have any other symptoms? (e.g., difficulty breathing, headache, rash)     Headache due sinus congestion right nare  Protocols used: Sore Throat-A-AH

## 2024-02-26 NOTE — Telephone Encounter (Signed)
 Appt scheduled

## 2024-02-26 NOTE — Progress Notes (Signed)
 Chief Complaint  Patient presents with   Sore Throat    Patient has a very sore, swollen and burning throat. Symptoms began on Monday.    Francisco Lawson Radish here for URI complaints.  Patient presents with severe throat pain that began Monday as a scratchy sore throat upon waking, resolving as the day progressed. Symptoms recurred Tuesday with similar morning soreness that improved later in the day. On Wednesday, patient awoke with significant sore throat 8/10 pain. He describes a sensation that his larynx feels elevated and resting on the tongue.  Denies sick contacts.  Patient denies fever, chills, SOB, CP, palpitations, dyspnea, edema, HA, vision changes, N/V/D, abdominal pain, urinary symptoms, rash, weight changes, and recent illness or hospitalizations.   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    Objective BP 122/82 (BP Location: Left Arm, Patient Position: Sitting, Cuff Size: Large)   Pulse 96   Temp 97.7 F (36.5 C)   Resp 14   Ht 6' 7 (2.007 m)   Wt 227 lb 6.4 oz (103.1 kg)   SpO2 97%   BMI 25.62 kg/m  General: Awake, alert, appears stated age HEENT: AT, [place], ears patent b/l and TM's neg, nares patent w/o discharge, pharnyx mild erythema and without exudates, MMM Neck: No masses or asymmetry Heart: RRR Lungs: CTAB, no accessory muscle use +wheezing Psych: Age appropriate judgment and insight, normal mood and affect  Viral URI  Sore throat - Plan: POC Covid19/Flu A&B Antigen, POCT rapid strep A Covid, FLU, and Strep negative Likely viral URI Rx- Flonase  May use Tylenol  or NSAIDS OTC prn  FU if no improvement Continue to push fluids, practice good hand hygiene, cover  mouth when coughing. F/u prn. If starting to experience fevers, shaking, or shortness of breath, seek immediate care. Pt voiced understanding and agreement to the plan.  [name], DNP, AGNP-C 02/26/24 4:06 PM

## 2024-03-17 ENCOUNTER — Encounter: Admitting: Medical

## 2024-05-21 ENCOUNTER — Ambulatory Visit: Admitting: Student
